# Patient Record
Sex: Female | Born: 1955 | Race: White | Hispanic: No | Marital: Married | Smoking: Never smoker
Health system: Southern US, Community
[De-identification: ages and names within clinical notes are randomized; demographics above are authoritative.]

## PROBLEM LIST (undated history)

## (undated) DIAGNOSIS — M8580 Other specified disorders of bone density and structure, unspecified site: Secondary | ICD-10-CM

## (undated) DIAGNOSIS — M810 Age-related osteoporosis without current pathological fracture: Secondary | ICD-10-CM

## (undated) DIAGNOSIS — K219 Gastro-esophageal reflux disease without esophagitis: Secondary | ICD-10-CM

## (undated) DIAGNOSIS — Z862 Personal history of diseases of the blood and blood-forming organs and certain disorders involving the immune mechanism: Secondary | ICD-10-CM

## (undated) DIAGNOSIS — E785 Hyperlipidemia, unspecified: Secondary | ICD-10-CM

## (undated) DIAGNOSIS — D509 Iron deficiency anemia, unspecified: Secondary | ICD-10-CM

## (undated) DIAGNOSIS — R740 Nonspecific elevation of levels of transaminase and lactic acid dehydrogenase [LDH]: Secondary | ICD-10-CM

## (undated) DIAGNOSIS — D6851 Activated protein C resistance: Secondary | ICD-10-CM

## (undated) DIAGNOSIS — D689 Coagulation defect, unspecified: Secondary | ICD-10-CM

## (undated) HISTORY — DX: Personal history of diseases of the blood and blood-forming organs and certain disorders involving the immune mechanism: Z86.2

## (undated) HISTORY — DX: Gastro-esophageal reflux disease without esophagitis: K21.9

## (undated) HISTORY — DX: Hyperlipidemia, unspecified: E78.5

## (undated) HISTORY — DX: Age-related osteoporosis without current pathological fracture: M81.0

## (undated) HISTORY — DX: Iron deficiency anemia, unspecified: D50.9

## (undated) HISTORY — DX: Coagulation defect, unspecified: D68.9

## (undated) HISTORY — DX: Nonspecific elevation of levels of transaminase and lactic acid dehydrogenase (ldh): R74.0

## (undated) HISTORY — DX: Other specified disorders of bone density and structure, unspecified site: M85.80

## (undated) HISTORY — PX: CARPAL TUNNEL RELEASE: SHX101

## (undated) HISTORY — DX: Activated protein C resistance: D68.51

---

## 1998-07-08 ENCOUNTER — Encounter: Payer: Self-pay | Admitting: Gynecology

## 1998-07-08 ENCOUNTER — Ambulatory Visit (HOSPITAL_COMMUNITY): Admission: RE | Admit: 1998-07-08 | Discharge: 1998-07-08 | Payer: Self-pay | Admitting: Gynecology

## 2000-01-15 ENCOUNTER — Encounter: Payer: Self-pay | Admitting: Gynecology

## 2000-01-15 ENCOUNTER — Encounter: Admission: RE | Admit: 2000-01-15 | Discharge: 2000-01-15 | Payer: Self-pay | Admitting: Gynecology

## 2000-03-29 ENCOUNTER — Other Ambulatory Visit: Admission: RE | Admit: 2000-03-29 | Discharge: 2000-03-29 | Payer: Self-pay | Admitting: Gynecology

## 2001-04-18 ENCOUNTER — Encounter: Admission: RE | Admit: 2001-04-18 | Discharge: 2001-04-18 | Payer: Self-pay | Admitting: Gynecology

## 2001-04-18 ENCOUNTER — Encounter: Payer: Self-pay | Admitting: Gynecology

## 2001-06-22 ENCOUNTER — Other Ambulatory Visit: Admission: RE | Admit: 2001-06-22 | Discharge: 2001-06-22 | Payer: Self-pay | Admitting: Gynecology

## 2001-06-27 ENCOUNTER — Encounter: Payer: Self-pay | Admitting: Gynecology

## 2001-06-27 ENCOUNTER — Encounter: Admission: RE | Admit: 2001-06-27 | Discharge: 2001-06-27 | Payer: Self-pay | Admitting: Gynecology

## 2001-07-12 ENCOUNTER — Encounter: Payer: Self-pay | Admitting: Neurology

## 2001-07-12 ENCOUNTER — Ambulatory Visit (HOSPITAL_COMMUNITY): Admission: RE | Admit: 2001-07-12 | Discharge: 2001-07-12 | Payer: Self-pay | Admitting: Neurology

## 2001-09-06 ENCOUNTER — Ambulatory Visit (HOSPITAL_BASED_OUTPATIENT_CLINIC_OR_DEPARTMENT_OTHER): Admission: RE | Admit: 2001-09-06 | Discharge: 2001-09-06 | Payer: Self-pay | Admitting: Orthopedic Surgery

## 2002-07-19 ENCOUNTER — Encounter: Payer: Self-pay | Admitting: Internal Medicine

## 2002-07-19 ENCOUNTER — Encounter: Admission: RE | Admit: 2002-07-19 | Discharge: 2002-07-19 | Payer: Self-pay | Admitting: Internal Medicine

## 2002-09-20 ENCOUNTER — Other Ambulatory Visit: Admission: RE | Admit: 2002-09-20 | Discharge: 2002-09-20 | Payer: Self-pay | Admitting: Gynecology

## 2003-09-10 ENCOUNTER — Encounter: Admission: RE | Admit: 2003-09-10 | Discharge: 2003-09-10 | Payer: Self-pay | Admitting: Gynecology

## 2004-03-19 ENCOUNTER — Other Ambulatory Visit: Admission: RE | Admit: 2004-03-19 | Discharge: 2004-03-19 | Payer: Self-pay | Admitting: Gynecology

## 2004-10-15 ENCOUNTER — Encounter: Admission: RE | Admit: 2004-10-15 | Discharge: 2004-10-15 | Payer: Self-pay | Admitting: Gynecology

## 2005-04-20 HISTORY — PX: UPPER GASTROINTESTINAL ENDOSCOPY: SHX188

## 2005-04-20 HISTORY — PX: COLONOSCOPY: SHX174

## 2005-04-20 LAB — HM COLONOSCOPY

## 2005-06-04 ENCOUNTER — Other Ambulatory Visit: Admission: RE | Admit: 2005-06-04 | Discharge: 2005-06-04 | Payer: Self-pay | Admitting: Gynecology

## 2005-07-22 ENCOUNTER — Ambulatory Visit: Payer: Self-pay | Admitting: Internal Medicine

## 2005-08-12 ENCOUNTER — Ambulatory Visit (HOSPITAL_COMMUNITY): Admission: RE | Admit: 2005-08-12 | Discharge: 2005-08-12 | Payer: Self-pay | Admitting: Internal Medicine

## 2005-08-18 ENCOUNTER — Encounter: Admission: RE | Admit: 2005-08-18 | Discharge: 2005-08-18 | Payer: Self-pay | Admitting: Internal Medicine

## 2005-08-18 ENCOUNTER — Encounter: Payer: Self-pay | Admitting: Internal Medicine

## 2005-08-25 ENCOUNTER — Ambulatory Visit: Payer: Self-pay | Admitting: Internal Medicine

## 2005-09-22 ENCOUNTER — Ambulatory Visit: Payer: Self-pay | Admitting: Internal Medicine

## 2005-11-05 ENCOUNTER — Encounter: Admission: RE | Admit: 2005-11-05 | Discharge: 2005-11-05 | Payer: Self-pay | Admitting: Gynecology

## 2005-11-13 ENCOUNTER — Encounter: Admission: RE | Admit: 2005-11-13 | Discharge: 2005-11-13 | Payer: Self-pay | Admitting: Gynecology

## 2005-11-20 ENCOUNTER — Ambulatory Visit: Payer: Self-pay | Admitting: Internal Medicine

## 2005-12-03 ENCOUNTER — Ambulatory Visit (HOSPITAL_COMMUNITY): Admission: RE | Admit: 2005-12-03 | Discharge: 2005-12-03 | Payer: Self-pay | Admitting: Internal Medicine

## 2005-12-29 ENCOUNTER — Ambulatory Visit: Payer: Self-pay | Admitting: Internal Medicine

## 2005-12-29 ENCOUNTER — Encounter: Payer: Self-pay | Admitting: Internal Medicine

## 2006-02-08 ENCOUNTER — Ambulatory Visit: Payer: Self-pay | Admitting: Internal Medicine

## 2006-05-05 ENCOUNTER — Encounter: Payer: Self-pay | Admitting: Internal Medicine

## 2006-05-05 LAB — CONVERTED CEMR LAB: Vit D, 1,25-Dihydroxy: 21 (ref 20–57)

## 2006-05-31 ENCOUNTER — Ambulatory Visit: Payer: Self-pay | Admitting: Internal Medicine

## 2006-09-07 DIAGNOSIS — G5600 Carpal tunnel syndrome, unspecified upper limb: Secondary | ICD-10-CM | POA: Insufficient documentation

## 2006-09-07 DIAGNOSIS — M8580 Other specified disorders of bone density and structure, unspecified site: Secondary | ICD-10-CM

## 2006-09-07 DIAGNOSIS — Z8719 Personal history of other diseases of the digestive system: Secondary | ICD-10-CM | POA: Insufficient documentation

## 2006-09-07 DIAGNOSIS — M810 Age-related osteoporosis without current pathological fracture: Secondary | ICD-10-CM | POA: Insufficient documentation

## 2006-09-07 DIAGNOSIS — K219 Gastro-esophageal reflux disease without esophagitis: Secondary | ICD-10-CM

## 2006-09-07 DIAGNOSIS — D649 Anemia, unspecified: Secondary | ICD-10-CM | POA: Insufficient documentation

## 2006-09-07 HISTORY — DX: Gastro-esophageal reflux disease without esophagitis: K21.9

## 2006-12-07 ENCOUNTER — Ambulatory Visit (HOSPITAL_COMMUNITY): Admission: RE | Admit: 2006-12-07 | Discharge: 2006-12-07 | Payer: Self-pay | Admitting: Gynecology

## 2006-12-15 ENCOUNTER — Other Ambulatory Visit: Admission: RE | Admit: 2006-12-15 | Discharge: 2006-12-15 | Payer: Self-pay | Admitting: Gynecology

## 2007-01-25 ENCOUNTER — Ambulatory Visit: Payer: Self-pay | Admitting: Internal Medicine

## 2007-01-26 ENCOUNTER — Encounter (INDEPENDENT_AMBULATORY_CARE_PROVIDER_SITE_OTHER): Payer: Self-pay | Admitting: *Deleted

## 2007-01-28 ENCOUNTER — Telehealth (INDEPENDENT_AMBULATORY_CARE_PROVIDER_SITE_OTHER): Payer: Self-pay | Admitting: *Deleted

## 2007-02-03 ENCOUNTER — Ambulatory Visit: Payer: Self-pay | Admitting: Internal Medicine

## 2007-02-06 LAB — CONVERTED CEMR LAB: Vit D, 1,25-Dihydroxy: 34 (ref 30–89)

## 2007-02-07 ENCOUNTER — Encounter (INDEPENDENT_AMBULATORY_CARE_PROVIDER_SITE_OTHER): Payer: Self-pay | Admitting: *Deleted

## 2007-02-24 ENCOUNTER — Ambulatory Visit: Payer: Self-pay | Admitting: Internal Medicine

## 2007-02-25 ENCOUNTER — Encounter (INDEPENDENT_AMBULATORY_CARE_PROVIDER_SITE_OTHER): Payer: Self-pay | Admitting: *Deleted

## 2007-02-25 LAB — CONVERTED CEMR LAB
ALT: 15 units/L (ref 0–35)
Albumin: 4 g/dL (ref 3.5–5.2)
BUN: 11 mg/dL (ref 6–23)
Basophils Absolute: 0 10*3/uL (ref 0.0–0.1)
Basophils Relative: 0.6 % (ref 0.0–1.0)
Bilirubin, Direct: 0.1 mg/dL (ref 0.0–0.3)
CO2: 33 meq/L — ABNORMAL HIGH (ref 19–32)
Chloride: 104 meq/L (ref 96–112)
Eosinophils Absolute: 0.2 10*3/uL (ref 0.0–0.6)
Eosinophils Relative: 3 % (ref 0.0–5.0)
GFR calc Af Amer: 135 mL/min
Glucose, Bld: 88 mg/dL (ref 70–99)
MCHC: 35.5 g/dL (ref 30.0–36.0)
MCV: 90.9 fL (ref 78.0–100.0)
Monocytes Relative: 7 % (ref 3.0–11.0)
Neutro Abs: 3.5 10*3/uL (ref 1.4–7.7)
Neutrophils Relative %: 59.3 % (ref 43.0–77.0)
Sodium: 141 meq/L (ref 135–145)
TSH: 2.47 microintl units/mL (ref 0.35–5.50)
Total Bilirubin: 0.6 mg/dL (ref 0.3–1.2)

## 2007-03-03 ENCOUNTER — Ambulatory Visit: Payer: Self-pay | Admitting: Internal Medicine

## 2007-03-03 DIAGNOSIS — E782 Mixed hyperlipidemia: Secondary | ICD-10-CM | POA: Insufficient documentation

## 2007-03-03 LAB — CONVERTED CEMR LAB: LDL Goal: 160 mg/dL

## 2007-05-05 ENCOUNTER — Telehealth (INDEPENDENT_AMBULATORY_CARE_PROVIDER_SITE_OTHER): Payer: Self-pay | Admitting: *Deleted

## 2007-08-08 ENCOUNTER — Telehealth (INDEPENDENT_AMBULATORY_CARE_PROVIDER_SITE_OTHER): Payer: Self-pay | Admitting: *Deleted

## 2008-01-03 ENCOUNTER — Ambulatory Visit (HOSPITAL_COMMUNITY): Admission: RE | Admit: 2008-01-03 | Discharge: 2008-01-03 | Payer: Self-pay | Admitting: Gynecology

## 2008-01-09 ENCOUNTER — Encounter: Admission: RE | Admit: 2008-01-09 | Discharge: 2008-01-09 | Payer: Self-pay | Admitting: Gynecology

## 2008-01-18 ENCOUNTER — Other Ambulatory Visit: Admission: RE | Admit: 2008-01-18 | Discharge: 2008-01-18 | Payer: Self-pay | Admitting: Gynecology

## 2008-02-08 ENCOUNTER — Telehealth (INDEPENDENT_AMBULATORY_CARE_PROVIDER_SITE_OTHER): Payer: Self-pay | Admitting: *Deleted

## 2008-03-02 ENCOUNTER — Telehealth (INDEPENDENT_AMBULATORY_CARE_PROVIDER_SITE_OTHER): Payer: Self-pay | Admitting: *Deleted

## 2008-03-16 ENCOUNTER — Telehealth (INDEPENDENT_AMBULATORY_CARE_PROVIDER_SITE_OTHER): Payer: Self-pay | Admitting: *Deleted

## 2008-06-14 ENCOUNTER — Telehealth (INDEPENDENT_AMBULATORY_CARE_PROVIDER_SITE_OTHER): Payer: Self-pay | Admitting: *Deleted

## 2008-07-02 ENCOUNTER — Telehealth (INDEPENDENT_AMBULATORY_CARE_PROVIDER_SITE_OTHER): Payer: Self-pay | Admitting: *Deleted

## 2008-07-03 ENCOUNTER — Telehealth (INDEPENDENT_AMBULATORY_CARE_PROVIDER_SITE_OTHER): Payer: Self-pay | Admitting: *Deleted

## 2008-08-10 ENCOUNTER — Telehealth (INDEPENDENT_AMBULATORY_CARE_PROVIDER_SITE_OTHER): Payer: Self-pay | Admitting: *Deleted

## 2008-11-29 ENCOUNTER — Telehealth (INDEPENDENT_AMBULATORY_CARE_PROVIDER_SITE_OTHER): Payer: Self-pay | Admitting: *Deleted

## 2008-12-05 ENCOUNTER — Encounter: Payer: Self-pay | Admitting: Internal Medicine

## 2008-12-05 LAB — CONVERTED CEMR LAB
Albumin: 4 g/dL (ref 3.5–5.2)
Alkaline Phosphatase: 59 units/L (ref 39–117)
BUN: 14 mg/dL (ref 6–23)
Basophils Relative: 1 % (ref 0–1)
Bilirubin, Direct: 0.1 mg/dL (ref 0.0–0.3)
CO2: 27 meq/L (ref 19–32)
Calcium: 9.1 mg/dL (ref 8.4–10.5)
Creatinine, Ser: 0.88 mg/dL (ref 0.40–1.20)
Eosinophils Absolute: 0.2 10*3/uL (ref 0.0–0.7)
Eosinophils Relative: 4 % (ref 0–5)
Glucose, Bld: 92 mg/dL (ref 70–99)
HCT: 38.2 % (ref 36.0–46.0)
HDL: 42 mg/dL (ref >39)
Indirect Bilirubin: 0.3 mg/dL (ref 0.0–0.9)
Lymphocytes Relative: 34 % (ref 12–46)
Lymphs Abs: 1.9 10*3/uL (ref 0.7–4.0)
MCV: 89.9 fL (ref 78.0–100.0)
Neutro Abs: 2.9 10*3/uL (ref 1.7–7.7)
Neutrophils Relative %: 52 % (ref 43–77)
TSH: 3.035 microintl units/mL (ref 0.350–4.500)
Total CHOL/HDL Ratio: 3.9
Total Protein: 6.9 g/dL (ref 6.0–8.3)
Triglycerides: 98 mg/dL (ref <150)
WBC: 5.6 10*3/uL (ref 4.0–10.5)

## 2008-12-12 ENCOUNTER — Ambulatory Visit: Payer: Self-pay | Admitting: Internal Medicine

## 2008-12-12 LAB — CONVERTED CEMR LAB
Bilirubin Urine: NEGATIVE
Glucose, Urine, Semiquant: NEGATIVE
Ketones, urine, test strip: NEGATIVE
Nitrite: NEGATIVE
Protein, U semiquant: NEGATIVE
pH: 6

## 2008-12-13 ENCOUNTER — Telehealth (INDEPENDENT_AMBULATORY_CARE_PROVIDER_SITE_OTHER): Payer: Self-pay | Admitting: *Deleted

## 2008-12-14 ENCOUNTER — Encounter: Payer: Self-pay | Admitting: Internal Medicine

## 2008-12-19 ENCOUNTER — Telehealth (INDEPENDENT_AMBULATORY_CARE_PROVIDER_SITE_OTHER): Payer: Self-pay | Admitting: *Deleted

## 2008-12-25 ENCOUNTER — Encounter (INDEPENDENT_AMBULATORY_CARE_PROVIDER_SITE_OTHER): Payer: Self-pay | Admitting: *Deleted

## 2008-12-25 LAB — CONVERTED CEMR LAB: Vit D, 25-Hydroxy: 37 ng/mL (ref 30–89)

## 2009-01-25 ENCOUNTER — Ambulatory Visit (HOSPITAL_COMMUNITY): Admission: RE | Admit: 2009-01-25 | Discharge: 2009-01-25 | Payer: Self-pay | Admitting: Internal Medicine

## 2009-01-25 ENCOUNTER — Encounter: Payer: Self-pay | Admitting: Internal Medicine

## 2009-02-01 ENCOUNTER — Ambulatory Visit: Payer: Self-pay | Admitting: Internal Medicine

## 2009-02-01 LAB — CONVERTED CEMR LAB: OCCULT 3: NEGATIVE

## 2009-02-04 ENCOUNTER — Encounter (INDEPENDENT_AMBULATORY_CARE_PROVIDER_SITE_OTHER): Payer: Self-pay | Admitting: *Deleted

## 2009-02-20 ENCOUNTER — Telehealth (INDEPENDENT_AMBULATORY_CARE_PROVIDER_SITE_OTHER): Payer: Self-pay | Admitting: *Deleted

## 2009-03-28 ENCOUNTER — Ambulatory Visit (HOSPITAL_COMMUNITY): Admission: RE | Admit: 2009-03-28 | Discharge: 2009-03-28 | Payer: Self-pay | Admitting: Gynecology

## 2009-04-01 ENCOUNTER — Ambulatory Visit (HOSPITAL_COMMUNITY): Admission: RE | Admit: 2009-04-01 | Discharge: 2009-04-01 | Payer: Self-pay | Admitting: Gynecology

## 2009-04-02 ENCOUNTER — Ambulatory Visit: Payer: Self-pay | Admitting: Internal Medicine

## 2009-04-02 ENCOUNTER — Encounter (INDEPENDENT_AMBULATORY_CARE_PROVIDER_SITE_OTHER): Payer: Self-pay | Admitting: *Deleted

## 2009-04-05 ENCOUNTER — Ambulatory Visit: Payer: Self-pay | Admitting: Internal Medicine

## 2009-05-29 ENCOUNTER — Telehealth (INDEPENDENT_AMBULATORY_CARE_PROVIDER_SITE_OTHER): Payer: Self-pay | Admitting: *Deleted

## 2009-08-14 ENCOUNTER — Telehealth (INDEPENDENT_AMBULATORY_CARE_PROVIDER_SITE_OTHER): Payer: Self-pay | Admitting: *Deleted

## 2009-11-15 ENCOUNTER — Telehealth (INDEPENDENT_AMBULATORY_CARE_PROVIDER_SITE_OTHER): Payer: Self-pay | Admitting: *Deleted

## 2009-11-27 ENCOUNTER — Encounter: Payer: Self-pay | Admitting: Internal Medicine

## 2010-01-24 ENCOUNTER — Encounter: Payer: Self-pay | Admitting: Internal Medicine

## 2010-01-24 LAB — CONVERTED CEMR LAB
Albumin: 4.2 g/dL
Basophils Relative: 1 %
Calcium: 9.4 mg/dL
HDL: 43 mg/dL
Hemoglobin: 12.6 g/dL
LDL Cholesterol: 140 mg/dL
MCV: 89 fL
Monocytes Absolute: 0.5 10*3/uL
Potassium: 4.4 meq/L
RDW: 15.8 %
Sodium: 137 meq/L
TSH: 5.8 microintl units/mL
Total Protein: 7 g/dL

## 2010-02-04 ENCOUNTER — Telehealth: Payer: Self-pay | Admitting: Internal Medicine

## 2010-02-25 ENCOUNTER — Encounter: Payer: Self-pay | Admitting: Internal Medicine

## 2010-03-24 ENCOUNTER — Encounter: Payer: Self-pay | Admitting: Internal Medicine

## 2010-03-24 ENCOUNTER — Ambulatory Visit: Payer: Self-pay | Admitting: Internal Medicine

## 2010-03-24 DIAGNOSIS — E559 Vitamin D deficiency, unspecified: Secondary | ICD-10-CM | POA: Insufficient documentation

## 2010-03-27 ENCOUNTER — Ambulatory Visit: Payer: Self-pay | Admitting: Internal Medicine

## 2010-03-27 ENCOUNTER — Encounter: Payer: Self-pay | Admitting: Internal Medicine

## 2010-03-29 LAB — CONVERTED CEMR LAB: Vit D, 25-Hydroxy: 90 ng/mL — ABNORMAL HIGH (ref 30–89)

## 2010-03-31 ENCOUNTER — Ambulatory Visit (HOSPITAL_COMMUNITY)
Admission: RE | Admit: 2010-03-31 | Discharge: 2010-03-31 | Payer: Self-pay | Source: Home / Self Care | Attending: Gynecology | Admitting: Gynecology

## 2010-05-11 ENCOUNTER — Encounter: Payer: Self-pay | Admitting: Gynecology

## 2010-05-12 ENCOUNTER — Ambulatory Visit
Admission: RE | Admit: 2010-05-12 | Discharge: 2010-05-12 | Payer: Self-pay | Source: Home / Self Care | Attending: Internal Medicine | Admitting: Internal Medicine

## 2010-05-22 NOTE — Assessment & Plan Note (Signed)
Summary: CPX///SPH   Vital Signs:  Patient profile:   55 year old female Height:      63.25 inches (160.66 cm) Weight:      127 pounds (57.73 kg) BMI:     22.40 Temp:     97.9 degrees F (36.61 degrees C) oral Resp:     14 per minute BP sitting:   102 / 60  (left arm) Cuff size:   regular  Vitals Entered By: Shannon West CMA (March 24, 2010 1:04 PM)  Is Patient Diabetic? No Pain Assessment Patient in pain? no      Comments Patient notes that she is not fasting, bloodwork was done at the hospital. Patient also needs refills of Nexium and Alendronate.   History of Present Illness:       Shannon West is here for a physical; she is asymptomatic. [facility] reviewed: LDL 140.       GERD/HH followup:  The patient reports acid reflux, but denies sour taste in mouth, epigastric pain, chest pain, trouble swallowing, weight loss, and weight gain.  The patient denies the following alarm features: melena, dysphagia, hematemesis, and vomiting.  Symptoms are worse with alcohol, spicy foods, and citrus.  Prior evaluation has included EGD.  The patient has found the following treatments to be effective: a PPI, Nexium.    Lipid Management History:      Positive NCEP/ATP III risk factors include female age 59 years old or older.  Negative NCEP/ATP III risk factors include no history of early menopause without estrogen hormone replacement, non-diabetic, no family history for ischemic heart disease, non-tobacco-user status, non-hypertensive, no ASHD (atherosclerotic heart disease), no prior stroke/TIA, no peripheral vascular disease, and no history of aortic aneurysm.     Current Medications (verified): 1)  Nexium 40 Mg  Cpdr (Esomeprazole Magnesium) .Marland Kitchen.. 1 Once Daily 2)  Vitamin D3 1000 Unit Caps (Cholecalciferol) .... 3 By Mouth Am, 3 By Mouth Pm 3)  Vit D Level .... Dx: 733.90 4)  Calcium 600mg  .... 1 By Mouth Two Times A Day 5)  Alendronate Sodium 70 Mg Tabs (Alendronate Sodium) .Marland Kitchen.. 1 Weekly As  Directed 6)  Citracal D .... Take As Directed 7)  Zyrtec-D Allergy & Congestion 5-120 Mg Xr12h-Tab (Cetirizine-Pseudoephedrine) .Marland Kitchen.. 1 By Mouth Once Daily As Needed 8)  Nature Made Vit D .... As Directed 9)  [employer] Brand Advil .Marland Kitchen.. 1 By Mouth As Needed 10)  [employer] Brand Tylenol .Marland Kitchen.. 1 By Mouth As Needed 11)  Otc Meds .... 1.) Zyrtec D  2.)stool Softner  3.)calcium  4.) Vit D  5.)tylenol  6.)advil/ibuprofen  Allergies (verified): No Known Drug Allergies  Past History:  Past Medical History: Anemia, PMH of (borderline) GERD/ Hiatal Hernia Osteopenia (T score -2.4 @ R  femoral neck 2010) G2 P2 Hyperlipidemia: NMR Lipoprofile : LDL 104 with 1759 total & 1610 small dense particles for 16-18 % risk; LDL goal = <80. Framingham Study LDL goal = < 160. Vitamin D deficiency (21 in 04/2006)  Past Surgical History: Carpal tunnel release RUE Colonoscopy  negative  2007; upper endoscopy  2007: hiatal hernia , Dr  Stan Head  Family History: Father: ruptured AAA,elevated  lipids Mother: osteoporosis, lipids, colon adenomas, hypothyroidism Siblings: bro :DM,lipids   Social History: no diet Occupation:Interventional Radiology Technician Never Smoked Alcohol use-yes: rarely Regular exercise-yes:3X/week X  30  min  Review of Systems  The patient denies anorexia, fever, vision loss, decreased hearing, hoarseness, syncope, prolonged cough, hemoptysis, hematuria, suspicious skin lesions,  depression, unusual weight change, abnormal bleeding, enlarged lymph nodes, and angioedema.    Physical Exam  General:  well-nourished,thin;alert,appropriate and cooperative throughout examination Head:  Normocephalic and atraumatic without obvious abnormalities.  Eyes:  No corneal or conjunctival inflammation noted.  Perrla. Funduscopic exam benign, without hemorrhages, exudates or papilledema.  Ears:  External ear exam shows no significant lesions or deformities.  Otoscopic examination reveals clear  canals, tympanic membranes are intact bilaterally without bulging, retraction, inflammation or discharge. Hearing is grossly normal bilaterally. Nose:  External nasal examination shows no deformity or inflammation. Nasal mucosa are pink and moist without lesions or exudates. Mouth:  Oral mucosa and oropharynx without lesions or exudates.  Teeth in good repair. Neck:  No deformities, masses, or tenderness noted. Thyroid smal & firm Lungs:  Normal respiratory effort, chest expands symmetrically. Lungs are clear to auscultation, no crackles or wheezes. Heart:  Normal rate and regular rhythm. S1 and S2 normal without gallop, murmur, click, rub. S4 Abdomen:  Bowel sounds positive,abdomen soft and non-tender without masses, organomegaly or hernias noted. Aorta palpable w/o AAA Genitalia:  Dr Chevis Pretty Msk:  No deformity or scoliosis noted of thoracic or lumbar spine.   Pulses:  R and L carotid,radial,dorsalis pedis and posterior tibial pulses are full and equal bilaterally Extremities:  No clubbing, cyanosis, edema, or deformity noted with normal full range of motion of all joints.   Neurologic:  alert & oriented X3 and DTRs symmetrical and normal.   Skin:  Intact without suspicious lesions or rashes Cervical Nodes:  No lymphadenopathy noted Axillary Nodes:  No palpable lymphadenopathy Psych:  memory intact for recent and remote, normally interactive, and good eye contact.     Impression & Recommendations:  Problem # 1:  ROUTINE GENERAL MEDICAL EXAM@HEALTH  CARE FACL (ICD-V70.0)  Orders: EKG w/ Interpretation (93000)  Problem # 2:  GERD (ICD-530.81)  Her updated medication list for this problem includes:    Nexium 40 Mg Cpdr (Esomeprazole magnesium) .Marland Kitchen... 1 once daily  Problem # 3:  OSTEOPENIA (ICD-733.90)  Her updated medication list for this problem includes:    Alendronate Sodium 70 Mg Tabs (Alendronate sodium) .Marland Kitchen... 1 weekly as directed  Problem # 4:  VITAMIN D DEFICIENCY  (ICD-268.9)  Problem # 5:  HYPERLIPIDEMIA (ICD-272.2)  Complete Medication List: 1)  Nexium 40 Mg Cpdr (Esomeprazole magnesium) .Marland Kitchen.. 1 once daily 2)  Vitamin D3 1000 Unit Caps (Cholecalciferol) .... 3 by mouth am, 3 by mouth pm 3)  Vit D Level  .... Dx: 733.90 4)  Calcium 600mg   .... 1 by mouth two times a day 5)  Alendronate Sodium 70 Mg Tabs (Alendronate sodium) .Marland KitchenMarland KitchenMarland Kitchen 1 weekly as directed 6)  Citracal D  .... Take as directed 7)  Zyrtec-d Allergy & Congestion 5-120 Mg Xr12h-tab (Cetirizine-pseudoephedrine) .Marland Kitchen.. 1 by mouth once daily as needed 8)  Nature Made Vit D  .... As directed 9)  [employer] Brand Advil  .Marland Kitchen.. 1 by mouth as needed 10)  [facility] Brand Tylenol  .Marland Kitchen.. 1 by mouth as needed 11)  Otc Meds  .... 1.) zyrtec d  2.)stool softner  3.)calcium  4.) vit d  5.)tylenol  6.)advil/ibuprofen  Lipid Assessment/Plan:      Based on NCEP/ATP III, the patient's risk factor category is "0-1 risk factors".  The patient's lipid goals are as follows: Total cholesterol goal is 200; LDL cholesterol goal is 80; HDL cholesterol goal is 40; Triglyceride goal is 150.  Her LDL cholesterol goal has not been met.  Secondary causes  for hyperlipidemia have been ruled out.  She has been counseled on adjunctive measures for lowering her cholesterol and has been provided with dietary instructions.    Patient Instructions: 1)  Please schedule fasting labs: vitamin D level & [facility] Lipid Panel (1304X). 2)  Avoid foods high in acid (tomatoes, citrus juices, spicy foods). Avoid eating within two hours of lying down or before exercising. Do not over eat; try smaller more frequent meals. Elevate head of bed twelve inches when sleeping. Prescriptions: ALENDRONATE SODIUM 70 MG TABS (ALENDRONATE SODIUM) 1 weekly as directed  #12 x 3   Entered and Authorized by:   Marga Melnick MD   Signed by:   Marga Melnick MD on 03/24/2010   Method used:   Faxed to ...       [facility]* (retail)        [address]       [address] Shipping/mailing       [place], [place]  [address]       Ph: [phone]       Fax: [phone]   RxID:   [id number] NEXIUM 40 MG  CPDR (ESOMEPRAZOLE MAGNESIUM) 1 once daily  #90 x 3   Entered and Authorized by:   Marga Melnick MD   Signed by:   Marga Melnick MD on 03/24/2010   Method used:   Faxed to ...       [facility]* (retail)       [address]       [address] Shipping/mailing       [place], [place]  [address]       Ph: [phone]       Fax: [phone]   RxID:   [id number]    Orders Added: 1)  Est. Patient 40-64 years [99396] 2)  EKG w/ Interpretation [93000]

## 2010-05-22 NOTE — Progress Notes (Signed)
Summary: Request for OTC Meds   Phone Note Call from Patient Call back at Home Phone [phone]   Caller: Patient Summary of Call: Refill Request for OTC Meds Zyrtec D, Ctiracal D, Nature Made Vit D, [place] Brand Advil/Tylenol   Shonna Chock CMA  November 15, 2009 5:28 PM   Follow-up for Phone Call        Left message on VM informing patient to call with the [facility] location to send her RX's Follow-up by: Shonna Chock CMA,  November 18, 2009 4:51 PM  Additional Follow-up for Phone Call Additional follow up Details #1::        Patient called back yesterday afternoon and indicated to send 1 script with all the meds listed to [facility], Phone number is [phone]  I called [facility] and Faxed RX to [phone] Additional Follow-up by: Shonna Chock CMA,  November 20, 2009 11:24 AM    New/Updated Medications: * CITRACAL D take as directed ZYRTEC-D ALLERGY & CONGESTION 5-120 MG XR12H-TAB (CETIRIZINE-PSEUDOEPHEDRINE) 1 by mouth once daily as needed * NATURE MADE VIT D as directed * [facility] ADVIL 1 by mouth as needed * [facility] BRAND TYLENOL 1 by mouth as needed * OTC MEDS 1.) Zyrtec D  2.)Stool Softner  3.)Calcium  4.) Vit D  5.)Tylenol  6.)Advil/Ibuprofen Prescriptions: OTC MEDS 1.) Zyrtec D  2.)Stool Softner  3.)Calcium  4.) Vit D  5.)Tylenol  6.)Advil/Ibuprofen  #1 x prn   Entered by:   Shonna Chock CMA   Authorized by:   Marga Melnick MD   Signed by:   Shonna Chock CMA on 11/20/2009   Method used:   Print then Give to Patient   RxID:   [id number]   Appended Document: Request for OTC Meds  faxed.

## 2010-05-22 NOTE — Miscellaneous (Signed)
Summary: [facility] 01/24/10  Clinical Lists Changes  Observations: Added new observation of BASOPHIL %: 1.0 % (01/24/2010 15:10) Added new observation of % EOS AUTO: 3.0 % (01/24/2010 15:10) Added new observation of MONOCYTE %: 9.0 % (01/24/2010 15:10) Added new observation of LYMPHS %: 31.0 % (01/24/2010 15:10) Added new observation of RDW: 15.8 % (01/24/2010 15:10) Added new observation of MCHC RBC: 31.8 g/dL (84/16/6063 01:60) Added new observation of MCV: 89.0 fL (01/24/2010 15:10) Added new observation of HCT: 39.6 % (01/24/2010 15:10) Added new observation of ABSOLUTE BAS: 0.0 K/uL (01/24/2010 15:10) Added new observation of EOS ABSLT: 0.2 K/uL (01/24/2010 15:10) Added new observation of ABSOLUTE MON: 0.5 K/uL (01/24/2010 15:10) Added new observation of ABS LYMPHOCY: 1.9 K/uL (01/24/2010 15:10) Added new observation of PLATELETK/UL: 245 K/uL (01/24/2010 15:10) Added new observation of HGB: 12.6 g/dL (10/93/2355 73:22) Added new observation of WBC COUNT: 6.1 10*3/microliter (01/24/2010 15:10) Added new observation of CL SERUM: 99.0 meq/L (01/24/2010 15:10) Added new observation of K SERUM: 4.4 meq/L (01/24/2010 15:10) Added new observation of NA: 137.0 meq/L (01/24/2010 15:10) Added new observation of CALCIUM: 9.4 mg/dL (02/54/2706 23:76) Added new observation of ALBUMIN: 4.2 g/dL (28/31/5176 16:07) Added new observation of PROTEIN, TOT: 7.0 g/dL (37/01/6268 48:54) Added new observation of CREATININE: 0.8 mg/dL (62/70/3500 93:81) Added new observation of BUN: 16.0 mg/dL (82/99/3716 96:78) Added new observation of CO2 PLSM/SER: 28.0 meq/L (01/24/2010 15:10) Added new observation of TSH: 5.8 microintl units/mL (01/24/2010 15:10) Added new observation of BILI TOTAL: 0.4 mg/dL (93/81/0175 10:25) Added new observation of ALK PHOS: 64.0 units/L (01/24/2010 15:10) Added new observation of SGPT (ALT): 16.0 units/L (01/24/2010 15:10) Added new observation of SGOT (AST): 20.0 units/L  (01/24/2010 15:10) Added new observation of TRIGLYCERIDE: 119.0 mg/dL (85/27/7824 23:53) Added new observation of LDL: 140 mg/dL (61/44/3154 00:86) Added new observation of HDL: 43.0 mg/dL (76/19/5093 26:71) Added new observation of CHOLESTEROL: 207.0 mg/dL (24/58/0998 33:82)

## 2010-05-22 NOTE — Medication Information (Signed)
Summary: OTC Meds  OTC Meds   Imported By: Lanelle Bal 12/09/2009 09:17:57  _____________________________________________________________________  External Attachment:    Type:   Image     Comment:   External Document

## 2010-05-22 NOTE — Progress Notes (Signed)
Summary: Lab order--need for Dec 2011  Phone Note Call from Patient Call back at Work Phone [phone]   Caller: Patient Summary of Call: PATIENT IS SCHEDULED FOR CPX WITH DR HOPPER ON 12/5----SHE SAID CHRAE  WOULD FAX ORDERS TO HER AT WORK AT [place] AND SHE WOULD HAVE LABS DRAWN THERE--SAYS SHE WAS TOLD IN THE PAST THAT SHE NEEDED TO HAVE LIPID PROFILE PANEL, BUT DOESNT THINK SHE HAS HAD THAT YET  FAX NUMBER IS [phone] Initial call taken by: Jerolyn Shin,  February 04, 2010 10:25 AM  Follow-up for Phone Call        Patient will need Vitamin D, NMR Profile, cbc diff, bmet, lipid profile, liver profile and TSH. Dx:  v70.0, 272.2, and 268.9.  Called to discuss with patient and was notified that she is already gone for the day, will try back [place] tomorrow. Follow-up by: Lucious Groves CMA,  February 04, 2010 3:56 PM  Additional Follow-up for Phone Call Additional follow up Details #1::        Patient is not at work yet, must try back. Lucious Groves CMA  February 05, 2010 8:39 AM   Left message for patient to call back to office. Lucious Groves CMA  February 05, 2010 11:11 AM   Patient notified that the orders have been ordered, but she will wait until closer to her appt. She will call back when she needs the labs faxed to the number above. Lucious Groves CMA  February 05, 2010 11:51 AM

## 2010-05-22 NOTE — Assessment & Plan Note (Signed)
Summary: go over [facility] ///sph   Vital Signs:  Patient profile:   55 year old female Pulse rate:   64 / minute Resp:     12 per minute BP sitting:   116 / 70  History of Present Illness: Hyperlipidemia Follow-Up      This is a 55 year old woman who presents for Hyperlipidemia follow-up & review of [facility] Panel. Main risks appear to be elevated LP(a) & LDL  of 157. The patient denies the following symptoms: chest pain/pressure, exercise intolerance, dypsnea, palpitations, syncope, and pedal edema.  Dietary compliance has been good.  The patient reports exercising 3 X per week.  Adjunctive measures currently used by the patient include fiber.    Allergies: No Known Drug Allergies  Past History:  Past Medical History: Anemia, PMH of (borderline) GERD/ Hiatal Hernia Osteopenia (T score -2.4 @ R  femoral neck 2010) G2 P2 Hyperlipidemia: NMR Lipoprofile : LDL 104 with 1759 total & 1610 small dense particles, HDL 46, TG 112  for 16-18 % risk; LDL goal = <80. Framingham Study LDL goal = < 160.  LP(a) 76 ( < 20).No FH of premature CAD/MI. Vitamin D deficiency (21 in 04/2006)  Physical Exam  General:  well-nourished , thin,alert,appropriate and cooperative throughout examination Heart:  Normal rate and regular rhythm. S1 and S2 normal without gallop, murmur, click, rub or other extra sounds. Pulses:  R and L carotid,radial,dorsalis pedis and posterior tibial pulses are full and equal bilaterally Psych:  Intelligent & motivated   Impression & Recommendations:  Problem # 1:  HYPERLIPIDEMIA (ICD-272.2)  Her updated medication list for this problem includes:    Niaspan 1000 Mg Cr-tabs (Niacin (antihyperlipidemic)) .Marland Kitchen... 1 once daily as directed  Complete Medication List: 1)  Nexium 40 Mg Cpdr (Esomeprazole magnesium) .Marland Kitchen.. 1 once daily 2)  Vitamin D3 1000 Unit Caps (Cholecalciferol) .... 3 by mouth am, 3 by mouth pm 3)  Vit D Level  .... Dx: 733.90 4)  Calcium 600mg   .... 1  by mouth two times a day 5)  Alendronate Sodium 70 Mg Tabs (Alendronate sodium) .Marland KitchenMarland KitchenMarland Kitchen 1 weekly as directed 6)  Citracal D  .... Take as directed 7)  Zyrtec-d Allergy & Congestion 5-120 Mg Xr12h-tab (Cetirizine-pseudoephedrine) .Marland Kitchen.. 1 by mouth once daily as needed 8)  Nature Made Vit D  .... As directed 9)  [employer] Brand Advil  .Marland Kitchen.. 1 by mouth as needed 10)  [facility] Brand Tylenol  .Marland Kitchen.. 1 by mouth as needed 11)  Otc Meds  .... 1.) zyrtec d  2.)stool softner  3.)calcium  4.) vit d  5.)tylenol  6.)advil/ibuprofen 12)  Niaspan 1000 Mg Cr-tabs (Niacin (antihyperlipidemic)) .Marland Kitchen.. 1 once daily as directed  Patient Instructions: 1)  It is important that you exercise regularly at least 20 minutes 5 times a week. If you develop chest pain, have severe difficulty breathing, or feel very tired , stop exercising immediately and seek medical attention. 2)  Take an  81 mg coated Aspirin every day. 3)  Please schedule a follow-up appointment in 6 months. 4)  Lipid Panel prior to visit, ICD-9: Prescriptions: NIASPAN 1000 MG CR-TABS (NIACIN (ANTIHYPERLIPIDEMIC)) 1 once daily as directed  #30 x  5   Entered and Authorized by:   Marga Melnick MD   Signed by:   Marga Melnick MD on 05/12/2010   Method used:   Print then Give to Patient   RxID:   [id number]    Orders Added: 1)  Est. Patient  Level III [[address]]

## 2010-05-22 NOTE — Progress Notes (Signed)
Summary: OTC med request  Phone Note Call from Patient Call back at Work Phone [phone]   Caller: Patient Summary of Call: Message left on VM: Patient needs rx for OTC meds: Cough/Cold meds, pain releivers, sinus/allergy meds, and stool softner. Please fax to [phone]   I called and checked with a local pharmacy and they indicatecd that the meds have to be specific ie: Mucinex cough/cold vs cough/cold med. I called patient and left a message for her to call with specific names. Initial call taken by: Shonna Chock,  August 14, 2009 9:44 AM  Follow-up for Phone Call        Spoke with patient and she said she will have to sit down and really think about the specific brands she uses, patient said this will be a long list and she will just fax it to me when she has it avaliable Follow-up by: Shonna Chock,  August 15, 2009 11:08 AM

## 2010-05-22 NOTE — Progress Notes (Signed)
Summary: refill request  Phone Note Refill Request Message from:  Fax from Pharmacy on May 29, 2009 10:16 AM  Refills Requested: Medication #1:  ALENDRONATE SODIUM 70 MG TABS 1 weekly as directed.   Dosage confirmed as above?Dosage Confirmed Refill to [facility]   Initial call taken by: Michaelle Copas,  May 29, 2009 10:23 AM     Prescriptions: ALENDRONATE SODIUM 70 MG TABS (ALENDRONATE SODIUM) 1 weekly as directed  #12 x 3   Entered by:   Shonna Chock   Authorized by:   Marga Melnick MD   Signed by:   Shonna Chock on 05/29/2009   Method used:   Electronically to        [facility]* (retail)       [address]       [address] [place] [address]/mailing       [place], [place]  [address]       Ph: [phone]       Fax: [phone]   RxID:   [id number]

## 2010-05-29 ENCOUNTER — Telehealth: Payer: Self-pay | Admitting: Internal Medicine

## 2010-06-05 NOTE — Progress Notes (Signed)
Summary: Niaspan 500mg  Samples  Phone Note Call from Patient Call back at Home Phone [phone]   Summary of Call: Patient called noting that she has not had any problems with the Niaspan. She has been taking the 500mg  samples that we given to her in the office. Patient cannot pick up the prescription she was given because her temporary insurance has not kicked in yet. Patient would like to know if she could get more samples (I made her aware that we do not have the 1000, ONLY 500mg ). Is it ok to give the patient the 500mg  samples and should the patient take one or tow daily? Please advise. Initial call taken by: Lucious Groves CMA,  May 29, 2010 3:01 PM  Follow-up for Phone Call        continue 1 once daily until Rx coverage in place,then increase to 1000 mg; samples OK  Follow-up by: Marga Melnick MD,  May 30, 2010 5:28 AM  Additional Follow-up for Phone Call Additional follow up Details #1::        Patient notified. (#28 given). Additional Follow-up by: Lucious Groves CMA,  May 30, 2010 8:40 AM

## 2010-09-05 NOTE — Assessment & Plan Note (Signed)
[facility]                           GASTROENTEROLOGY OFFICE NOTE   NAME:Shannon West, Shannon West                        MRN:          [id number]  DATE:02/08/2006                            DOB:          14-Jun-1955    CHIEF COMPLAINT:  Followup of reflux problems after colonoscopy and  endoscopy.   Asti returns, doing well on Protonix 40 mg daily.  She said that Prilosec  worked well too, and would like to switch to that since it is cheaper.  There is no dysphagia.  I saw no stricture on her endoscopy, though she had  a small sliding hiatal hernia.  She, apparently, has some enlargement of her  thyroid on ultrasound, and I thought that might have had a relationship to  her dysphagia.  At any rate, there are no symptoms at this time.  Her small  bowel biopsies for sprue were negative.  No constipation now.  Colonoscopy  demonstrated some skin tags, otherwise normal.   VITAL SIGNS:  Weight 122 pounds.  Height 5 feet 3 inches.  Pulse 60.  Blood  pressure 100/60.   ASSESSMENT:  1. Dysphagia and reflux symptoms, better at this time.  There are no      worrisome findings on her endoscopic studies.  2. Normal colonoscopy.   PLAN:  1. Change to Prilosec OTC.  I have given her a prescription for that since      she can get that for 5 dollars with her med cost insurance.  2. She can try to taper off of that at some point to see if she needs it      only intermittently, but she is to take it daily for a while, perhaps a      couple of months.  3. It is okay to start Boniva.  4. Return to see me as needed.       Iva Boop, MD,FACG      CEG/MedQ  DD:  02/08/2006  DT:  02/09/2006  Job #:  [id number]   cc:   Titus Dubin. Alwyn Ren, MD,FACP,FCCP

## 2010-09-05 NOTE — Assessment & Plan Note (Signed)
[facility]                           GASTROENTEROLOGY OFFICE NOTE   NAME:Shannon West, Shannon West                        MRN:          [id number]  DATE:11/20/2005                            DOB:          1955-06-07    REQUESTING PHYSICIAN:  Titus Dubin. Alwyn Ren, MD, FCCP   REASON FOR CONSULTATION:  Question of reflux, anemia, constipation.   ASSESSMENT:  55 year old white woman with multiple problems.  She has a  borderline anemia with a low iron saturation, but a low normal iron and  normal transferrin.   She also has loss of dental enamel, and her dentist has questioned whether  she could have reflux disease.  There is also some dysphagia, but apparently  enlargement of the thyroid on a recent thyroid ultrasound.  She belches  quite a bit as well.  She has fairly chronic constipation.   RECOMMENDATIONS AND PLAN:  1.  Barium swallow with tablet regarding the dysphagia.  2.  EGD regarding dysphagia and anemia and reflux symptoms.  3.  Colonoscopy, mainly for screening.  4.  Agree with holding bisphosphonate at this point.  She has osteopenia,      Boniva is considered.  5.  Note:  Her mother has had colon polyps.  I am not sure that raises her      risk of polyps or cancer all that much with one relative.  It sounds      like she was elderly when she had them.  6.  She uses well water, and both her husband and daughter have been told      they have poor enamel, so maybe that is the problem, i.e. lack of      fluoride.  She does use fluoridated toothpaste.  It is possible that      acid reflux can cause dental enamel loss, but to have the whole family      have reflux and dental enamel loss does not fit well, and I think it may      be the lack of fluoride in her water.  She will discuss this with her      dentist.   I will also check a ferritin and a repeat hemoglobin/CBC today.   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  She has  had chronic hard stools for years.  She burps and belches a lot, and  Prilosec is helping with heartburn, but she still has the burping and  belching.  She has been given a prescription for Boniva, but it has been  held because of the reflux problems, and sometimes food not going down well  and seems like it is sitting in suprasternal area.  She cannot give me  obvious specific examples of impact dysphagia, but says that things do not  go down quite right.  She has had an abnormal thyroid ultrasound, she tells  me, where the isthmus was thickened.  There has been no weight loss.  She  has tried stool softeners and  things like that to try to help her bowels  move better, there is some quality of life issue there.  She has tried some  fiber supplements, but never for a long time.  Other issues as described  above.  Stool cards have been negative when tested.  Laboratory  investigation, in April 2007, showed hemoglobin 11.9, lipid panel showed an  LDL of 117, she had a normal CMET, normal TSH and free T4.   PAST MEDICAL HISTORY:  1.  Newly diagnosed osteopenia.  2.  Seasonal allergies.  3.  No surgeries.   FAMILY HISTORY:  Prostate cancer in her grandfather, heart disease in her  father, diabetes in her grandmother.   SOCIAL HISTORY:  As above, married, one son, one daughter.  No alcohol,  tobacco or drugs.   REVIEW OF SYSTEMS:  See medical history form.   PHYSICAL EXAMINATION:  GENERAL:  Petite, thin, well-developed white woman.  VITAL SIGNS:  Height 5 feet 3 inches, weight 122 pounds.  Blood pressure  100/60, pulse 68.  EYES:  Anicteric.  ENT:  Normal mouth and posterior pharynx.  NECK:  Supple, there is no mass.  Perhaps slight thyromegaly right greater  than left lobes.  ABDOMEN:  Soft, nontender, without organomegaly or mass.  HEART:  S1, S2, no murmurs or gallops.  LUNGS:  Clear.  RECTAL:  Exam  deferred.  LYMPHATIC:  No neck or supraclavicular nodes.  EXTREMITIES:  No edema.  SKIN:  No rash.  NEUROLOGIC:  She is alert and oriented x3.   Note:  Sprue was a consideration in this lady as well with her osteopenia  and her bowel habit issues, etcetera.  We can consider testing for that,  though I could take biopsies of the small bowel at the time of her EGD as  well.   I appreciate the opportunity to care for this patient.  We have recommended  fiber supplementation and a high fiber diet as well.  Further plans pending  the above.                                   Iva Boop, MD, [facility]   CEG/MedQ  DD:  11/20/2005  DT:  11/21/2005  Job #:  [id number]   cc:   Titus Dubin. Alwyn Ren, MD, FCCP

## 2010-09-05 NOTE — Op Note (Signed)
[place]  Patient:    Shannon West, Shannon West Visit Number: [id number] MRN: [id number]          Service Type: DSU Location: [facility] Attending Physician:  Susa Day Dictated by:   Katy Fitch Naaman Plummer., M.D. Proc. Date: 09/06/01 Admit Date:  09/06/2001                             Operative Report  PREOPERATIVE DIAGNOSIS:  Right carpal tunnel syndrome.  POSTOPERATIVE DIAGNOSIS:  Right carpal tunnel syndrome.  OPERATION PERFORMED:  Release of right transverse carpal ligament.  OPERATING SURGEON:  Josephine Igo, M.D.  ASSISTANT:  Annye Rusk, P.A-C.  ANESTHESIA:  General by mask.  ANESTHESIOLOGIST:  Dr. Randa Evens.  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  She has had a longstanding history of hand numbness consistent with carpal tunnel syndrome only on the right side.  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  Electrodiagnostic studies completed by Dr. Johna Roles on August 01, 2001 demonstrated marked prolongation of her motor and sensory latencies as well as a very prolonged lumbrical interosseous difference on the right and normal conduction studies on the left.  Due to failure of nonoperative measures, the patient is brought to the operating room at this time for release of her right transverse carpal ligament.  DESCRIPTION OF PROCEDURE:  [name] was brought to the operating room and placed in supine position on the operating table.  Following induction of general anesthesia, the right arm was prepped with Betadine soap and solution and sterilely draped.  The arm was exsanguinated with an Esmarch bandage and an arterial tourniquet on the proximal brachium was inflated to 220 mHg.  The  procedure commenced with a short incision in line of the ring finger in the palm.  The subcutaneous tissues were carefully divided revealing the palmar fascia.  The fibers of the fascia were split longitudinally to reveal the common sensory branch of the median nerve and superficial palmar arch.  The median nerve proper was gently isolated from the transverse carpal ligament and the ligament was released on its ulnar border with scissors extending into the distal forearm.  This widely opened the carpal canal.  The ulnar bursa was noted to be thickened and fibrotic.  There were no masses or other predicaments noted.  Bleeding points along the margins of the released ligament were electrocauterized with bipolar current followed by repair of the skin with intradermal 3-0 Prolene suture.  A compressive dressing was applied with a volar plaster splint maintaining the wrist in 5 degrees dorsiflexion. Dictated by:   Katy Fitch Naaman Plummer., M.D. Attending Physician:  Susa Day DD:  09/06/01 TD:  09/06/01 Job: [id number]

## 2010-10-19 ENCOUNTER — Inpatient Hospital Stay (INDEPENDENT_AMBULATORY_CARE_PROVIDER_SITE_OTHER)
Admission: RE | Admit: 2010-10-19 | Discharge: 2010-10-19 | Disposition: A | Discharge status: Home / Self Care | Payer: Self-pay | Source: Ambulatory Visit | Attending: Emergency Medicine | Admitting: Emergency Medicine

## 2010-10-19 ENCOUNTER — Encounter: Payer: Self-pay | Admitting: Emergency Medicine

## 2010-10-19 DIAGNOSIS — E860 Dehydration: Secondary | ICD-10-CM

## 2010-10-19 DIAGNOSIS — K5289 Other specified noninfective gastroenteritis and colitis: Secondary | ICD-10-CM | POA: Insufficient documentation

## 2010-10-21 ENCOUNTER — Telehealth (INDEPENDENT_AMBULATORY_CARE_PROVIDER_SITE_OTHER): Payer: Self-pay | Admitting: *Deleted

## 2010-10-29 ENCOUNTER — Other Ambulatory Visit: Payer: Self-pay | Admitting: Internal Medicine

## 2010-10-29 DIAGNOSIS — E785 Hyperlipidemia, unspecified: Secondary | ICD-10-CM

## 2010-10-30 ENCOUNTER — Other Ambulatory Visit (INDEPENDENT_AMBULATORY_CARE_PROVIDER_SITE_OTHER): Payer: 59

## 2010-10-30 DIAGNOSIS — E785 Hyperlipidemia, unspecified: Secondary | ICD-10-CM

## 2010-10-30 NOTE — Progress Notes (Signed)
Labs only

## 2010-11-01 ENCOUNTER — Encounter: Payer: Self-pay | Admitting: Internal Medicine

## 2010-11-06 ENCOUNTER — Encounter: Payer: Self-pay | Admitting: Internal Medicine

## 2010-11-06 ENCOUNTER — Ambulatory Visit (INDEPENDENT_AMBULATORY_CARE_PROVIDER_SITE_OTHER): Payer: 59 | Admitting: Internal Medicine

## 2010-11-06 DIAGNOSIS — E782 Mixed hyperlipidemia: Secondary | ICD-10-CM

## 2010-11-06 NOTE — Patient Instructions (Signed)
Please review Dr Gildardo Griffes book Eat, Drink & Be Healthy for dietary cholesterol information due to your borderline cholesterol hyperabsorption. Exercise  30-45  minutes a day, 3-4 days a week. Walking is especially valuable in preventing Osteoporosis. Eat a low-fat diet with lots of fruits and vegetables, up to 7-9 servings per day.  Take the Niaspan with unsweetened applesauce. The  Pectin in the applesauce  will help mitigate any flushing. The Niaspan as multiple benefits: Increase in HDL; decreasing Lp(a); and  LDL  particle size

## 2010-11-06 NOTE — Progress Notes (Signed)
Addended byMarga Melnick F on: 11/06/2010 05:17 PM   Modules accepted: Level of Service

## 2010-11-06 NOTE — Progress Notes (Signed)
  Subjective:    Patient ID: Shannon West, female    DOB: 1956/02/04, 55 y.o.   MRN: [id number]  HPI Dyslipidemia assessment: Prior Advanced Lipid Testing: NMR 2008: LDL goal = < 75.   Family history of premature CAD/ MI: no .  Nutrition: heart healthy .  Exercise: 2X/week . Diabetes : A1c 5.6% in 12/11 . HTN: no. Smoking history  : never .   Weight :  stable.  Lab results reviewed :LDL 111(157 in  12/11).     Review of Systems ROS: fatigue: no ; chest pain : no ;claudication: no; palpitations: no; abd pain/bowel changes: no ; myalgias:no;  syncope : no ; memory loss: no;skin changes: no; Flushing : X 2.     Objective:   Physical Exam Gen.: Thin but healthy and well-nourished in appearance. Alert, appropriate and cooperative throughout exam.  Lungs: Normal respiratory effort; chest expands symmetrically. Lungs are clear to auscultation without rales, wheezes, or increased work of breathing. Heart: Normal rate and rhythm. Normal S1 and S2. No gallop, click, or rub. S4 w/o  murmur.                                                                                    Musculoskeletal/extremities:  No clubbing, cyanosis, edema, or deformity noted. .Joints normal. Nail health  good. Vascular: Carotid, radial artery, dorsalis pedis and  posterior tibial pulses are full and equal. No bruits present. Neurologic: Alert and oriented x3.  Skin: Intact without suspicious lesions or rashes. Psych: Mood and affect are normal. Normally interactive                                                                                         Assessment & Plan:  #1 dyslipidemia; significant improvement in LDL. It has dropped from 157 in 04/1999 to 111. Optimal would be less than 100  Advanced cholesterol testing does suggest that she may have increased absorption of cholesterol. Dr. Gildardo Griffes book is  recommended along with a regular cardiovascular exercise  #3 LP(a) is a significant risk ; it is   recommended she continue the Niaspan. The  flushing could be prevented  with the ingestion of applesauce

## 2010-12-26 ENCOUNTER — Other Ambulatory Visit: Payer: Self-pay | Admitting: Internal Medicine

## 2011-03-23 NOTE — Progress Notes (Signed)
Summary: dizzy/anorexia/diarrhea/TM(rm4)   Vital Signs:  Patient Profile:   55 Years Old Female CC:      dizzy ;pst pf appeti Height:     63.25 inches (160.66 cm) Weight:      118.8 pounds O2 Sat:      99 % O2 treatment:    Room Air Temp:     98.3 degrees F oral Pulse rate:   88 / minute Resp:     18 per minute BP supine:   113 / 74  (left arm) BP sitting:   103 / 76  (left arm) BP standing:   101 / 69  (left arm) Cuff size:   regular  Vitals Entered By: Linton Flemings RN (October 19, 2010 12:05 PM)                  Updated Prior Medication List: NEXIUM 40 MG  CPDR (ESOMEPRAZOLE MAGNESIUM) 1 once daily VITAMIN D3 1000 UNIT CAPS (CHOLECALCIFEROL) 3 by mouth am, 3 by mouth pm * VIT D LEVEL DX: 733.90 * CALCIUM 600MG  1 by mouth two times a day ALENDRONATE SODIUM 70 MG TABS (ALENDRONATE SODIUM) 1 weekly as directed * CITRACAL D take as directed ZYRTEC-D ALLERGY & CONGESTION 5-120 MG XR12H-TAB (CETIRIZINE-PSEUDOEPHEDRINE) 1 by mouth once daily as needed * NATURE MADE VIT D as directed * [employer] BRAND ADVIL 1 by mouth as needed * [employer] BRAND TYLENOL 1 by mouth as needed * OTC MEDS 1.) Zyrtec D  2.)Stool Softner  3.)Calcium  4.) Vit D  5.)Tylenol  6.)Advil/Ibuprofen NIASPAN 1000 MG CR-TABS (NIACIN (ANTIHYPERLIPIDEMIC)) 1 once daily as directed  Current Allergies (reviewed today): No known allergies History of Present Illness History from: patient Chief Complaint: dizzy ;pst pf appeti History of Present Illness: She is having 4 days of chills that have turned into fatigue, malaise, not wanting to eat, a feeling of dehydration, loose stools (no blood), and dizziness this past 1-2 days.  She reports that her symptoms are mild but are lingering and she feels she needs IVF.  She is taking Tylenol and Ibu which have helped.  She hasn't been eating or drinking much because nothing tastes good.  She is otherwise healthy.  No rashes, tick bites, recent travel, sick contacts.  No  urinary symptoms.  No new meds.  REVIEW OF SYSTEMS Constitutional Symptoms       Complains of fever and chills.     Denies night sweats, weight loss, weight gain, and fatigue.  Eyes       Denies change in vision, eye pain, eye discharge, glasses, contact lenses, and eye surgery. Ear/Nose/Throat/Mouth       Denies hearing loss/aids, change in hearing, ear pain, ear discharge, dizziness, frequent runny nose, frequent nose bleeds, sinus problems, sore throat, hoarseness, and tooth pain or bleeding.  Respiratory       Denies dry cough, productive cough, wheezing, shortness of breath, asthma, bronchitis, and emphysema/COPD.  Cardiovascular       Complains of fanting.      Denies murmurs, chest pain, and fainting.    Gastrointestinal       Complains of nausea/vomiting and diarrhea.      Denies stomach pain, constipation, blood in bowel movements, and indigestion. Genitourniary       Denies painful urination, kidney stones, and loss of urinary control. Neurological       Denies paralysis, seizures, and fainting/blackouts. Musculoskeletal       Denies muscle pain, joint pain, joint stiffness, decreased range  of motion, redness, swelling, muscle weakness, and gout.  Skin       Denies bruising, unusual mles/lumps or sores, and hair/skin or nail changes.  Psych       Denies mood changes, temper/anger issues, anxiety/stress, speech problems, depression, and sleep problems.  Past History:  Past Medical History: Reviewed history from 05/12/2010 and no changes required. Anemia, PMH of (borderline) GERD/ Hiatal Hernia Osteopenia (T score -2.4 @ R  femoral neck 2010) G2 P2 Hyperlipidemia: NMR Lipoprofile : LDL 104 with 1759 total & 1610 small dense particles, HDL 46, TG 112  for 16-18 % risk; LDL goal = <80. Framingham Study LDL goal = < 160.  LP(a) 76 ( < 20).No FH of premature CAD/MI. Vitamin D deficiency (21 in 04/2006)  Past Surgical History: Reviewed history from 03/24/2010 and no changes  required. Carpal tunnel release RUE Colonoscopy  negative  2007; upper endoscopy  2007: hiatal hernia , Dr  Stan Head  Family History: Reviewed history from 03/24/2010 and no changes required. Father: ruptured AAA,elevated  lipids Mother: osteoporosis, lipids, colon adenomas, hypothyroidism Siblings: bro :DM,lipids   Social History: Reviewed history from 03/24/2010 and no changes required. no diet Occupation:Interventional Radiology Technician Never Smoked Alcohol use-yes: rarely Regular exercise-yes:3X/week X  30  min Physical Exam General appearance: well developed, well nourished, no acute distress Head: normocephalic, atraumatic Pupils: PERRL EOMI Ears: normal, no lesions or deformities Nasal: mucosa pink, nonedematous, no septal deviation, turbinates normal Oral/Pharynx: tongue normal, posterior pharynx without erythema or exudate Chest/Lungs: no rales, wheezes, or rhonchi bilateral, breath sounds equal without effort Heart: regular rate and  rhythm, no murmur Abdomen: soft, non-tender without obvious organomegaly Neurological: CN2-12 intact, normal strength distally, normal gait Back: no CVA tenderness MSE: oriented to time, place, and person Assessment New Problems: GASTROENTERITIS (ICD-558.9) DEHYDRATION (ICD-276.51)  after IVF, pt feels better, less weak  Plan New Orders: New Patient Level IV [99204] IV Fluids 1st hr.- Non Medicare [96360] T-CBC w/Diff [[id number]] Planning Comments:   Gave her 1L NS IVF.  She may have mild dehydration from loose stools and not drinking as much vs from a viral gastroenteritis.  A CBC was performed (Hgb 10.5, Plt 159, WBC 11.3 - Gr 79%, Ly 15%).  No meds given since not very nauseated and no signs of bacterial cause.  Encourage BRAT diet and hydration with water and gatorade.  If not improving, follow up with your PCP.  If worsening, can consider antibiotics or repeating CBC or checking UA, etc.  As of today, symptoms are vague  enough to try conservative treatment for a few days.   The patient and/or caregiver has been counseled thoroughly with regard to medications prescribed including dosage, schedule, interactions, rationale for use, and possible side effects and they verbalize understanding.  Diagnoses and expected course of recovery discussed and will return if not improved as expected or if the condition worsens. Patient and/or caregiver verbalized understanding.   Orders Added: 1)  New Patient Level IV [99204] 2)  IV Fluids 1st hr.- Non Medicare [96360] 3)  T-CBC w/Diff [[id number]]

## 2011-03-23 NOTE — Telephone Encounter (Signed)
  Phone Note Outgoing Call Call back at Home Phone [phone] P [facility]     Call placed by: Lajean Saver RN,  October 21, 2010 2:40 PM Call placed to: Patient Summary of Call: Callback: No answer. Message left with reason for call and call with questions or concerns Initial call taken by: Lajean Saver RN,  October 21, 2010 2:40 PM

## 2011-03-27 ENCOUNTER — Other Ambulatory Visit: Payer: Self-pay | Admitting: Internal Medicine

## 2011-04-01 ENCOUNTER — Other Ambulatory Visit: Payer: Self-pay | Admitting: Internal Medicine

## 2011-04-17 ENCOUNTER — Other Ambulatory Visit: Payer: Self-pay | Admitting: Gynecology

## 2011-04-17 DIAGNOSIS — Z1231 Encounter for screening mammogram for malignant neoplasm of breast: Secondary | ICD-10-CM

## 2011-05-26 ENCOUNTER — Encounter: Payer: 59 | Admitting: Internal Medicine

## 2011-05-27 ENCOUNTER — Ambulatory Visit (INDEPENDENT_AMBULATORY_CARE_PROVIDER_SITE_OTHER): Payer: 59 | Admitting: Internal Medicine

## 2011-05-27 ENCOUNTER — Other Ambulatory Visit: Payer: Self-pay | Admitting: Internal Medicine

## 2011-05-27 ENCOUNTER — Encounter: Payer: Self-pay | Admitting: Internal Medicine

## 2011-05-27 VITALS — BP 116/70 | HR 68 | Temp 98.5°F | Resp 12 | Ht 63.03 in | Wt 125.4 lb

## 2011-05-27 DIAGNOSIS — E782 Mixed hyperlipidemia: Secondary | ICD-10-CM

## 2011-05-27 DIAGNOSIS — Z832 Family history of diseases of the blood and blood-forming organs and certain disorders involving the immune mechanism: Secondary | ICD-10-CM

## 2011-05-27 DIAGNOSIS — Z0000 Encounter for general adult medical examination without abnormal findings: Secondary | ICD-10-CM

## 2011-05-27 NOTE — Patient Instructions (Signed)
Preventive Health Care: Exercise  30-45  minutes a day, 3-4 days a week. Walking is especially valuable in preventing Osteoporosis. Eat a low-fat diet with lots of fruits and vegetables, up to 7-9 servings per day. Consume less than 30 grams of sugar per day from foods & drinks with High Fructose Corn Syrup as # 1,2,3 or #4 on label.  Please  schedule fasting Labs : BMET,Lipids, hepatic panel, CBC & dif, TSH, vit D level, Leiden Factor V level. PLEASE BRING THESE INSTRUCTIONS TO FOLLOW UP  LAB APPOINTMENT.This will guarantee correct labs are drawn, eliminating need for repeat blood sampling ( needle sticks ! ). Diagnoses /Codes: V70.0, V18.3. Normal T scores on a bone density exam (BMD)  are +1 to -1. Osteopenia would be -1.1 to -2.4. Osteoporosis is defined by a  T score worse than -2.4. Treatment should be considered  with  T scores worse than -1.5, particularly if there is  family history of low bone density or personal  past history of atypical fractue.BMD should be monitored every 25 months. Recommended lifestyle interventions to prevent  Osteoporosis include calcium 600 mg twice a day  & vitamin D3 supplementation to keep vitamin  D  level @ least 40-60. The usual vitamin D3 dose is 1000 IU daily; but individual dose is determined by annual vitamin D level monitor.

## 2011-05-27 NOTE — Progress Notes (Signed)
Subjective:    Patient ID: LENZIE MONTESANO, female    DOB: Dec 08, 1955, 56 y.o.   MRN: [id number]  HPI  Mrs. Vanderhoef is here for a physical;she denies acute issues.       Review of Systems Patient reports no significant  vision/ hearing  changes, adenopathy,fever, weight change,  persistant / recurrent hoarseness , swallowing issues, chest pain,palpitations,edema,persistant /recurrent cough, hemoptysis, dyspnea( rest/ exertional/paroxysmal nocturnal), gastrointestinal bleeding(melena, rectal bleeding), abdominal pain, significant heartburn,  bowel changes,GU symptoms(dysuria, hematuria,pyuria, incontinence), Gyn symptoms(abnormal  bleeding , pain),  syncope, focal weakness, memory loss,numbness & tingling, skin/hair /nail changes,abnormal bruising or bleeding, anxiety,or depression.       Objective:   Physical Exam Gen.: Thin but healthy and well-nourished in appearance. Alert, appropriate and cooperative throughout exam. Head: Normocephalic without obvious abnormalities  Eyes: No corneal or conjunctival inflammation noted. Pupils equal round reactive to light and accommodation. Fundal exam is benign without hemorrhages, exudate, papilledema. Extraocular motion intact. Vision grossly normal with lenses. Ears: External  ear exam reveals no significant lesions or deformities. Canals clear .TMs normal. Hearing is grossly normal bilaterally. Nose: External nasal exam reveals no deformity or inflammation. Nasal mucosa are pink and moist. No lesions or exudates noted.  Mouth: Oral mucosa and oropharynx reveal no lesions or exudates. Teeth in good repair. Neck: No deformities, masses, or tenderness noted. Range of motion essentially normal. Thyroid normal Lungs: Normal respiratory effort; chest expands symmetrically. Lungs are clear to auscultation without rales, wheezes, or increased work of breathing. Heart: Normal rate and rhythm. Normal S1 and S2. No gallop, click, or rub. S4 with slurring at  LSB ;no  murmur. Abdomen: Bowel sounds normal; abdomen soft and nontender. No masses, organomegaly or hernias noted. Genitalia: Dr Chevis Pretty.                                                                                   Musculoskeletal/extremities: No deformity or scoliosis noted of  the thoracic or lumbar spine. No clubbing, cyanosis, edema, or deformity noted. Range of motion  normal .Tone & strength  normal.Joints normal. Nail health  good. Vascular: Carotid, radial artery, dorsalis pedis and  posterior tibial pulses are full and equal. No bruits present. Neurologic: Alert and oriented x3. Deep tendon reflexes symmetrical and normal.          Skin: Intact without suspicious lesions or rashes. Lymph: No cervical, axillary lymphadenopathy present. Psych: Mood and affect are normal. Normally interactive                                                                                         Assessment & Plan:  #1 comprehensive physical exam; no acute findings #2 see Problem List with Assessments & Recommendations  #3 family history of clotting abnormality with deep venous thrombosis (Leiden factor V deficiency) Plan: see Orders

## 2011-05-28 ENCOUNTER — Other Ambulatory Visit: Payer: 59

## 2011-06-01 ENCOUNTER — Telehealth: Payer: Self-pay

## 2011-06-01 NOTE — Telephone Encounter (Signed)
Message left on voicemail: [facility] Patient will call to schedule appointment we just need to fax order over  Dr.Hopper please place order and close encounter

## 2011-06-02 ENCOUNTER — Other Ambulatory Visit: Payer: Self-pay | Admitting: Internal Medicine

## 2011-06-02 DIAGNOSIS — M8580 Other specified disorders of bone density and structure, unspecified site: Secondary | ICD-10-CM

## 2011-06-08 ENCOUNTER — Other Ambulatory Visit: Payer: Self-pay | Admitting: Internal Medicine

## 2011-06-08 DIAGNOSIS — E559 Vitamin D deficiency, unspecified: Secondary | ICD-10-CM

## 2011-06-08 DIAGNOSIS — M899 Disorder of bone, unspecified: Secondary | ICD-10-CM

## 2011-06-08 NOTE — Telephone Encounter (Signed)
I attempted to enter the orderfor BMD; it is in the computer as having been placed 06/02/11. Dx: osteopenia and vitamin D deficiency

## 2011-06-08 NOTE — Telephone Encounter (Signed)
Renee please verify if this order was already faxed. Per Dr.Hopper order already entered

## 2011-06-08 NOTE — Telephone Encounter (Signed)
Order is in the system.  I spoke with patient, made her aware, she will now call and schedule her mmg & bone density.

## 2011-06-08 NOTE — Telephone Encounter (Signed)
Dr.Hopper please place order for BMD

## 2011-06-15 ENCOUNTER — Other Ambulatory Visit (HOSPITAL_COMMUNITY): Payer: Self-pay | Admitting: Gynecology

## 2011-06-15 DIAGNOSIS — Z1231 Encounter for screening mammogram for malignant neoplasm of breast: Secondary | ICD-10-CM

## 2011-06-16 ENCOUNTER — Ambulatory Visit: Payer: 59

## 2011-06-18 ENCOUNTER — Other Ambulatory Visit (INDEPENDENT_AMBULATORY_CARE_PROVIDER_SITE_OTHER): Payer: 59

## 2011-06-18 DIAGNOSIS — Z832 Family history of diseases of the blood and blood-forming organs and certain disorders involving the immune mechanism: Secondary | ICD-10-CM

## 2011-06-18 DIAGNOSIS — Z0000 Encounter for general adult medical examination without abnormal findings: Secondary | ICD-10-CM

## 2011-06-18 LAB — CBC WITH DIFFERENTIAL/PLATELET
Eosinophils Relative: 2.9 % (ref 0.0–5.0)
Monocytes Relative: 8.2 % (ref 3.0–12.0)
Neutrophils Relative %: 52.3 % (ref 43.0–77.0)
Platelets: 216 10*3/uL (ref 150.0–400.0)
WBC: 5.2 10*3/uL (ref 4.5–10.5)

## 2011-06-18 LAB — BASIC METABOLIC PANEL
CO2: 28 mEq/L (ref 19–32)
Calcium: 8.9 mg/dL (ref 8.4–10.5)
GFR: 87.48 mL/min (ref >60.00)
Sodium: 136 mEq/L (ref 135–145)

## 2011-06-18 LAB — HEPATIC FUNCTION PANEL
ALT: 38 U/L — ABNORMAL HIGH (ref 0–35)
AST: 35 U/L (ref 0–37)
Albumin: 3.8 g/dL (ref 3.5–5.2)
Total Protein: 7.1 g/dL (ref 6.0–8.3)

## 2011-06-18 LAB — LIPID PANEL
HDL: 53.7 mg/dL (ref >39.00)
Triglycerides: 80 mg/dL (ref 0.0–149.0)

## 2011-06-18 LAB — TSH: TSH: 2.27 u[IU]/mL (ref 0.35–5.50)

## 2011-06-19 LAB — VITAMIN D 25 HYDROXY (VIT D DEFICIENCY, FRACTURES): Vit D, 25-Hydroxy: 72 ng/mL (ref 30–89)

## 2011-06-23 ENCOUNTER — Other Ambulatory Visit: Payer: Self-pay | Admitting: Internal Medicine

## 2011-06-23 DIAGNOSIS — D6851 Activated protein C resistance: Secondary | ICD-10-CM | POA: Insufficient documentation

## 2011-06-29 ENCOUNTER — Other Ambulatory Visit: Payer: Self-pay | Admitting: Internal Medicine

## 2011-06-29 NOTE — Telephone Encounter (Signed)
Prescription sent to pharmacy.

## 2011-06-30 ENCOUNTER — Telehealth: Payer: Self-pay | Admitting: Oncology

## 2011-06-30 NOTE — Telephone Encounter (Signed)
Called pt ,left message to call us back to confirm appt. °

## 2011-07-03 ENCOUNTER — Telehealth: Payer: Self-pay | Admitting: Oncology

## 2011-07-03 NOTE — Telephone Encounter (Signed)
Talked to pt, she is aware of appt of appt and location.

## 2011-07-06 ENCOUNTER — Telehealth: Payer: Self-pay | Admitting: Oncology

## 2011-07-06 NOTE — Telephone Encounter (Signed)
Referred by Dr.William Alwyn Ren Dx- Factor V Risk

## 2011-07-09 ENCOUNTER — Ambulatory Visit (HOSPITAL_COMMUNITY)
Admission: RE | Admit: 2011-07-09 | Discharge: 2011-07-09 | Disposition: A | Discharge status: Home / Self Care | Payer: 59 | Source: Ambulatory Visit | Attending: Gynecology | Admitting: Gynecology

## 2011-07-09 ENCOUNTER — Ambulatory Visit (HOSPITAL_COMMUNITY)
Admission: RE | Admit: 2011-07-09 | Discharge: 2011-07-09 | Disposition: A | Discharge status: Home / Self Care | Payer: 59 | Source: Ambulatory Visit | Attending: Internal Medicine | Admitting: Internal Medicine

## 2011-07-09 DIAGNOSIS — Z1231 Encounter for screening mammogram for malignant neoplasm of breast: Secondary | ICD-10-CM | POA: Insufficient documentation

## 2011-07-09 DIAGNOSIS — E559 Vitamin D deficiency, unspecified: Secondary | ICD-10-CM

## 2011-07-09 DIAGNOSIS — M949 Disorder of cartilage, unspecified: Secondary | ICD-10-CM

## 2011-07-09 DIAGNOSIS — M899 Disorder of bone, unspecified: Secondary | ICD-10-CM

## 2011-07-13 ENCOUNTER — Other Ambulatory Visit: Payer: Self-pay | Admitting: Oncology

## 2011-07-13 ENCOUNTER — Encounter: Payer: Self-pay | Admitting: Oncology

## 2011-07-13 ENCOUNTER — Telehealth: Payer: Self-pay | Admitting: *Deleted

## 2011-07-13 DIAGNOSIS — R7401 Elevation of levels of liver transaminase levels: Secondary | ICD-10-CM

## 2011-07-13 DIAGNOSIS — D6851 Activated protein C resistance: Secondary | ICD-10-CM

## 2011-07-13 DIAGNOSIS — D509 Iron deficiency anemia, unspecified: Secondary | ICD-10-CM

## 2011-07-13 HISTORY — DX: Elevation of levels of liver transaminase levels: R74.01

## 2011-07-13 HISTORY — DX: Iron deficiency anemia, unspecified: D50.9

## 2011-07-13 NOTE — Telephone Encounter (Signed)
per orders patient was scheduled on 07-03-2011 for the 07-15-2011

## 2011-07-15 ENCOUNTER — Ambulatory Visit (HOSPITAL_BASED_OUTPATIENT_CLINIC_OR_DEPARTMENT_OTHER): Payer: 59 | Admitting: Oncology

## 2011-07-15 ENCOUNTER — Ambulatory Visit: Payer: 59

## 2011-07-15 ENCOUNTER — Other Ambulatory Visit (HOSPITAL_BASED_OUTPATIENT_CLINIC_OR_DEPARTMENT_OTHER): Payer: 59 | Admitting: Lab

## 2011-07-15 ENCOUNTER — Telehealth: Payer: Self-pay | Admitting: *Deleted

## 2011-07-15 VITALS — BP 126/69 | HR 59 | Temp 97.1°F | Ht 63.0 in | Wt 123.2 lb

## 2011-07-15 DIAGNOSIS — D6851 Activated protein C resistance: Secondary | ICD-10-CM

## 2011-07-15 DIAGNOSIS — D509 Iron deficiency anemia, unspecified: Secondary | ICD-10-CM

## 2011-07-15 DIAGNOSIS — D6859 Other primary thrombophilia: Secondary | ICD-10-CM

## 2011-07-15 DIAGNOSIS — R7401 Elevation of levels of liver transaminase levels: Secondary | ICD-10-CM

## 2011-07-15 LAB — CBC & DIFF AND RETIC
Basophils Absolute: 0 10*3/uL (ref 0.0–0.1)
EOS%: 1.9 % (ref 0.0–7.0)
Eosinophils Absolute: 0.1 10*3/uL (ref 0.0–0.5)
HCT: 35.4 % (ref 34.8–46.6)
HGB: 11.4 g/dL — ABNORMAL LOW (ref 11.6–15.9)
Immature Retic Fract: 7.2 % (ref 1.60–10.00)
MCH: 24.3 pg — ABNORMAL LOW (ref 25.1–34.0)
NEUT#: 2.9 10*3/uL (ref 1.5–6.5)
NEUT%: 54.3 % (ref 38.4–76.8)
lymph#: 1.8 10*3/uL (ref 0.9–3.3)

## 2011-07-15 LAB — MORPHOLOGY
PLT EST: ADEQUATE
RBC Comments: NORMAL

## 2011-07-15 NOTE — Progress Notes (Signed)
New Patient Hematology-Oncology Evaluation   Shannon West [id number] May 12, 1955 56 y.o. 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 One of her brothers had a deep venous thrombosis 2 years ago at age 20 and was found to carry the factor V Leiden gene mutation. That same brother suffered a sudden death syndrome one year later. He developed a hacking cough, went to bed, never woke up. No autopsy. Not on anticoagulation at time of his death.  Shannon West has never had a thrombotic event. She was on oral contraceptives for a number of years. She had 2 pregnancies without any complications. Her only surgical procedure was for a right carpal tunnel syndrome. She has a daughter age 54 who had a C-section following a normal pregnancy and did not have any a clotting problems. Her son is 53 years old and has no clotting problems. Her father died at age 62 of a ruptured abdominal aortic aneurysm. Her mother is still alive and well with no clotting history.   PMH: Past Medical History  Diagnosis Date  . History of anemia   . GERD (gastroesophageal reflux disease)   . Osteopenia   . Hyperlipemia   . Vitamin d deficiency     (04/2006)  . Hypochromic-microcytic anemia 07/13/2011    Hb 10.9 MCV 83.5 06/18/11  . Transaminitis 07/13/2011    Borderline  SGPT 38  06/18/11 normal SGOT    Past Surgical History  Procedure Date  . Carpal tunnel release     RUE  . Colonoscopy 2007    Negative  . Upper gastrointestinal endoscopy 2007    hiatal hernia  . Hiatal hernia repair     Dr.Carl Leone Payor    Allergies: No Known Allergies  Medications: Medications Prior to Admission  Medication Sig Dispense Refill  . alendronate (FOSAMAX) 70 MG tablet TAKE 1  TABLET BY MOUTH WEEKLY AS DIRECTED  12 tablet  3  . calcium carbonate (OS-CAL) 600 MG TABS Take 600 mg by mouth daily.       . Cetirizine-Pseudoephedrine (ZYRTEC-D ALLERGY & CONGESTION PO) Take by mouth as needed.        Marland Kitchen ibuprofen (ADVIL,MOTRIN) 200 MG tablet Take 200 mg by mouth as needed.        Marland Kitchen NEXIUM 40 MG capsule TAKE 1 CAPSULE BY MOUTH DAILY  90 capsule  1  . NIASPAN 1000 MG CR tablet TAKE 1 TABLET BY MOUTH DAILY AS DIRECTED  30 tablet  11  . Omega-3 Fatty Acids (FISH OIL) 1000 MG CAPS Take by mouth daily.      . Vitamin D, Ergocalciferol, (DRISDOL) 50000 UNITS CAPS Take 50,000 Units by mouth every other day.       No current facility-administered medications on file as of 07/15/2011.    Social History:   reports that she has never smoked. She does not have any smokeless tobacco history on file. She reports that she does not drink alcohol or use illicit drugs.  Family History: Family History  Problem Relation Age of Onset  . Hyperlipidemia Father   . Osteoporosis Mother   . Hyperlipidemia Mother   . Hypothyroidism Mother   . Diabetes Brother     died in sleep in context of pulmonary infection @ 28  . Hyperlipidemia Brother   .  Clotting disorder Brother     factor 5 deficiency with DVT    Review of Systems: Constitutional symptoms: No constitutional symptoms HEENT: Respiratory:  Cardiovascular:  Known hyperlipidemia on Niaspan Gastrointestinal ROS: Occasional reflux on Nexium Genito-Urinary ROS:  Hematological and Lymphatic: Chronic microcytic anemia. She has had colonoscopies at schedule times and has not had any obvious pathology Musculoskeletal: Osteopenia on calcium and Fosamax and vitamin D Neurologic: Dermatologic: Remaining ROS negative.  Physical Exam: Blood pressure 126/69, pulse 59, temperature 97.1 F (36.2 C), temperature source Oral, height 5\' 3"  (1.6 m), weight 123 lb 3.2 oz (55.883 kg). Wt Readings from Last 3 Encounters:  07/15/11 123 lb 3.2 oz  (55.883 kg)  05/27/11 125 lb 6.4 oz (56.881 kg)  11/06/10 122 lb 12.8 oz (55.702 kg)    General appearance: Thin Caucasian woman Head: Normal Neck: Oropharynx no erythema or exudate, no thyromegaly, Lymph nodes: No cervical or axillary adenopathy Breasts: Not examined Lungs: Clear to auscultation resonant to percussion Heart: Regular rhythm no murmur Abdominal: Soft nontender no mass no organomegaly GU: Not examined Extremities: No edema no calf tenderness. Calf circumference symmetric 34.5 cm bilaterally Neurologic: Pupils equal reactive to light optic discs sharp vessels normal, motor strength 5 over 5, reflexes 2+ symmetric Skin: No rash    Lab Results: Lab Results  Component Value Date   WBC 5.3 07/15/2011   HGB 11.4* 07/15/2011   HCT 35.4 07/15/2011   MCV 75.3* 07/15/2011   PLT 210 07/15/2011     Chemistry      Component Value Date/Time   NA 136 06/18/2011 0835   K 3.9 06/18/2011 0835   CL 102 06/18/2011 0835   CO2 28 06/18/2011 0835   BUN 18 06/18/2011 0835   CREATININE 0.7 06/18/2011 0835      Component Value Date/Time   CALCIUM 8.9 06/18/2011 0835   ALKPHOS 56 06/18/2011 0835   AST 35 06/18/2011 0835   ALT 38* 06/18/2011 0835   BILITOT 0.4 06/18/2011 0835       Review of peripheral blood: Normochromic microcytic red cells, mature neutrophils and lymphocytes, platelets with normal morphology    Impression and Plan: #1. Factor. V Leiden heterozygote status with no personal history of clotting despite multiple risk factors including previous use of oral contraceptives and 2 pregnancies.  Patient along with her husband were educated about the clotting risk associated with factor V Leiden status which in fact is quite minimal  increasing risk 4 to fivefold over  normal and to the same degree as the risk of either being pregnant or being on an oral contraceptive. The factor V Leiden does become important when combined with other traditional risk factors for clotting such as  infection, immobilization, or surgery. I informed her that anyone admitted to the hospital now a days receives prophylactic anticoagulation whether or not they have a genetic risk factor for clotting. She would not need any additional anticoagulation above and beyond the usual prophylaxis. Sometimes, if a patient has major surgery, I might extend prophylaxis for 2 weeks postop until the patient is fully ambulatory at home and the wound has healed. She is advised to avoid estrogens since we know there is a negative interaction which may. magnify  clotting risk in patients with the Leiden gene. Since the Leiden gene is so common, I am going to check her for the prothrombin gene mutation since the combined disorder is common and may impact on anticoagulation recommendations for the future.  There are currently no data  to support the use of  prophylactic anticoagulation even in patients who have had a prior clot who not currently on anticoagulation. However, anecdotally, I think the risk of taking an 81 mg dose of aspirin his low and may have some preventive effect. 2 recent studies in the literature support some prophylactic activity against recurrent venous thrombosis in patients who have had a prior venous clots who then go on maintenance aspirin. There is an approximate 30% risk reduction. Whether we can extrapolate this data to somebody who has not had a prior clot but has a congenital a risk factor for clotting is not established at this time. I will leave the decision whether or not to take a baby aspirin up to the patient.  #2. Microcytic anemia. Likely iron deficient. No clinical bleeding. Current on colonoscopies. I advised her to go on iron sulfate 3 times a day.      Levert Feinstein, MD 07/15/2011, 12:49 PM

## 2011-07-15 NOTE — Telephone Encounter (Signed)
per orders on 07-15-2011 patient to come back when needed ( PRN)

## 2011-07-17 LAB — HEPATIC FUNCTION PANEL
Bilirubin, Direct: 0.1 mg/dL (ref 0.0–0.3)
Indirect Bilirubin: 0.2 mg/dL (ref 0.0–0.9)
Total Bilirubin: 0.3 mg/dL (ref 0.3–1.2)

## 2011-07-17 LAB — IRON AND TIBC: UIBC: 364 ug/dL (ref 125–400)

## 2011-07-17 LAB — D-DIMER, QUANTITATIVE: D-Dimer, Quant: 0.84 ug/mL-FEU — ABNORMAL HIGH (ref 0.00–0.48)

## 2011-07-17 LAB — PROTHROMBIN GENE MUTATION

## 2011-07-31 ENCOUNTER — Encounter: Payer: Self-pay | Admitting: Internal Medicine

## 2011-11-13 ENCOUNTER — Other Ambulatory Visit: Payer: Self-pay | Admitting: Internal Medicine

## 2011-12-31 ENCOUNTER — Telehealth: Payer: Self-pay | Admitting: Internal Medicine

## 2011-12-31 MED ORDER — NIACIN ER (ANTIHYPERLIPIDEMIC) 1000 MG PO TBCR: EXTENDED_RELEASE_TABLET | ORAL | Status: DC

## 2011-12-31 MED ORDER — ESOMEPRAZOLE MAGNESIUM 40 MG PO CPDR: DELAYED_RELEASE_CAPSULE | ORAL | Status: DC

## 2011-12-31 MED ORDER — ALENDRONATE SODIUM 70 MG PO TABS: ORAL_TABLET | ORAL | Status: DC

## 2011-12-31 NOTE — Telephone Encounter (Signed)
RXs sent.

## 2011-12-31 NOTE — Telephone Encounter (Signed)
PT called left mess on triage line 242pm switching pharmacy's needs rx sent to [facility] [address] for the following:  nexium 40mg   Generic fosamax 70mg  niaspan 1000mg    Pt noted she needs next wk cb# [phone]  Last ov 2.6.13 V70

## 2012-01-06 ENCOUNTER — Telehealth: Payer: Self-pay | Admitting: Internal Medicine

## 2012-01-06 NOTE — Telephone Encounter (Signed)
I called patient and left message for her to contact the insurance company to get a copy of her formulary and schedule an appointment for medications to be changed. (Per MD request)

## 2012-01-06 NOTE — Telephone Encounter (Signed)
Patient states the medications we prescribed total $440 per month. She is asking if we can substitute anything less expensive for any of them. She also states she has been on fosamax for close to 5 years and she thinks that's the maximum and may be able to stop taking it. Please advise.

## 2012-01-27 ENCOUNTER — Ambulatory Visit (INDEPENDENT_AMBULATORY_CARE_PROVIDER_SITE_OTHER): Payer: PRIVATE HEALTH INSURANCE | Admitting: Internal Medicine

## 2012-01-27 ENCOUNTER — Encounter: Payer: Self-pay | Admitting: Internal Medicine

## 2012-01-27 VITALS — BP 110/70 | HR 83 | Wt 124.8 lb

## 2012-01-27 DIAGNOSIS — E782 Mixed hyperlipidemia: Secondary | ICD-10-CM

## 2012-01-27 DIAGNOSIS — K219 Gastro-esophageal reflux disease without esophagitis: Secondary | ICD-10-CM

## 2012-01-27 DIAGNOSIS — M949 Disorder of cartilage, unspecified: Secondary | ICD-10-CM

## 2012-01-27 DIAGNOSIS — E559 Vitamin D deficiency, unspecified: Secondary | ICD-10-CM

## 2012-01-27 DIAGNOSIS — D6851 Activated protein C resistance: Secondary | ICD-10-CM

## 2012-01-27 DIAGNOSIS — D6859 Other primary thrombophilia: Secondary | ICD-10-CM

## 2012-01-27 DIAGNOSIS — M899 Disorder of bone, unspecified: Secondary | ICD-10-CM

## 2012-01-27 MED ORDER — OMEPRAZOLE 20 MG PO CPDR: 20.0000 mg | DELAYED_RELEASE_CAPSULE | Freq: Every day | ORAL | Status: DC

## 2012-01-27 MED ORDER — PRAVASTATIN SODIUM 20 MG PO TABS: ORAL_TABLET | ORAL | Status: DC

## 2012-01-27 MED ORDER — ALENDRONATE SODIUM 70 MG PO TABS: 70.0000 mg | ORAL_TABLET | ORAL | Status: DC

## 2012-01-27 NOTE — Assessment & Plan Note (Signed)
Alendronate will be continued for an additional 12 months, for a total of 5 years. Bone density will be due in April of 2015

## 2012-01-27 NOTE — Assessment & Plan Note (Signed)
In view of the results of the AIM HIGH study and based on discussion with several Lipidologists; low-dose pravastatin would be recommended in place of the Niaspan

## 2012-01-27 NOTE — Assessment & Plan Note (Signed)
She'll be changed to omeprazole 20 mg daily because of the cost of Nexium

## 2012-01-27 NOTE — Progress Notes (Signed)
  Subjective:    Patient ID: Shannon West, female    DOB: 1955-06-13, 56 y.o.   MRN: [id number]  HPI She is here to discuss changing medications. She is on Niaspan because of an elevated Lp (a) and elevated LDL. This product cost $185 a month  She's been on Nexium for reflux; the cost of this drug is $219.  Her Fosamax because approximately $40 a month.  Her problem list was assessed and updated.    Review of Systems  Her reflux is quiescent. She specifically denies hoarseness, dysphagia, abdominal pain, unexplained weight loss, melena, rectal bleeding.  Her bone density is up to date. She has been on Fosamax for 4 years; this should be continued for an additional 12 months. Her vitamin D level is now therapeutic.       Objective:   Physical Exam General appearance:thin but well nourished; no acute distress or increased work of breathing is present.  No  lymphadenopathy about the head, neck, or axilla noted.   Eyes: No conjunctival inflammation or lid edema is present. There is no scleral icterus.  Oral exam: Dental hygiene is good; lips and gums are healthy appearing.There is no oropharyngeal erythema or exudate noted.   Neck:  No deformities, thyromegaly, masses, or tenderness noted.   Supple with full range of motion without pain.   Heart:  Normal rate and regular rhythm. S1 and S2 normal without gallop, murmur, click, rub or other extra sounds.   Lungs:Chest clear to auscultation; no wheezes, rhonchi,rales ,or rubs present.No increased work of breathing.    Abdomen: bowel sounds normal, soft and non-tender without masses, organomegaly or hernias noted.  No guarding or rebound    All pulses intact without  bruits .No ischemic skin changes.    Extremities:  No cyanosis, edema, or clubbing  noted    Skin: Warm & dry w/o jaundice or tenting.          Assessment & Plan:

## 2012-01-27 NOTE — Patient Instructions (Addendum)
Please review Dr Gildardo Griffes book Eat, Drink & Be Healthy for dietary cholesterol information.  Please  schedule fasting Labs in 10 weeks :  Lipid Panel , CK, hepatic panel. PLEASE BRING THESE INSTRUCTIONS TO FOLLOW UP  LAB APPOINTMENT.This will guarantee correct labs are drawn, eliminating need for repeat blood sampling ( needle sticks ! ). Diagnoses /Codes: 272.4,995.20  If you activate My Chart; the results can be released to you as soon as they populate from the lab. If you choose not to use this program; the labs have to be reviewed, copied & mailed   causing a delay in getting the results to you.

## 2012-04-18 ENCOUNTER — Other Ambulatory Visit (INDEPENDENT_AMBULATORY_CARE_PROVIDER_SITE_OTHER): Payer: PRIVATE HEALTH INSURANCE

## 2012-04-18 ENCOUNTER — Encounter: Payer: Self-pay | Admitting: *Deleted

## 2012-04-18 DIAGNOSIS — T887XXA Unspecified adverse effect of drug or medicament, initial encounter: Secondary | ICD-10-CM

## 2012-04-18 DIAGNOSIS — E785 Hyperlipidemia, unspecified: Secondary | ICD-10-CM

## 2012-04-18 LAB — LIPID PANEL
HDL: 41.9 mg/dL (ref >39.00)
LDL Cholesterol: 95 mg/dL (ref 0–99)
Total CHOL/HDL Ratio: 4
Triglycerides: 88 mg/dL (ref 0.0–149.0)
VLDL: 17.6 mg/dL (ref 0.0–40.0)

## 2012-04-18 LAB — HEPATIC FUNCTION PANEL
Albumin: 3.8 g/dL (ref 3.5–5.2)
Total Protein: 7.5 g/dL (ref 6.0–8.3)

## 2012-04-18 LAB — CK: Total CK: 54 U/L (ref 7–177)

## 2012-04-28 ENCOUNTER — Other Ambulatory Visit: Payer: Self-pay | Admitting: Internal Medicine

## 2012-11-24 ENCOUNTER — Other Ambulatory Visit: Payer: Self-pay | Admitting: Gynecology

## 2012-11-24 DIAGNOSIS — Z1231 Encounter for screening mammogram for malignant neoplasm of breast: Secondary | ICD-10-CM

## 2012-12-01 ENCOUNTER — Ambulatory Visit: Payer: PRIVATE HEALTH INSURANCE

## 2012-12-08 ENCOUNTER — Ambulatory Visit: Payer: PRIVATE HEALTH INSURANCE

## 2013-01-26 ENCOUNTER — Encounter: Payer: PRIVATE HEALTH INSURANCE | Admitting: Internal Medicine

## 2013-02-15 ENCOUNTER — Ambulatory Visit (INDEPENDENT_AMBULATORY_CARE_PROVIDER_SITE_OTHER): Payer: PRIVATE HEALTH INSURANCE | Admitting: Gynecology

## 2013-02-15 ENCOUNTER — Encounter: Payer: Self-pay | Admitting: Gynecology

## 2013-02-15 VITALS — BP 118/72 | HR 62 | Resp 16 | Ht 63.0 in | Wt 127.0 lb

## 2013-02-15 DIAGNOSIS — Z0000 Encounter for general adult medical examination without abnormal findings: Secondary | ICD-10-CM

## 2013-02-15 DIAGNOSIS — E7800 Pure hypercholesterolemia, unspecified: Secondary | ICD-10-CM | POA: Insufficient documentation

## 2013-02-15 DIAGNOSIS — Z01419 Encounter for gynecological examination (general) (routine) without abnormal findings: Secondary | ICD-10-CM

## 2013-02-15 DIAGNOSIS — Z124 Encounter for screening for malignant neoplasm of cervix: Secondary | ICD-10-CM

## 2013-02-15 LAB — POCT URINALYSIS DIPSTICK
Leukocytes, UA: NEGATIVE
Urobilinogen, UA: NEGATIVE

## 2013-02-15 NOTE — Patient Instructions (Signed)

## 2013-02-15 NOTE — Progress Notes (Signed)
57 y.o. Married Caucasian female   G2P2002 here for annual exam. Pt reports menses are absent. She does report hot flashes, does not have night sweats, does have vaginal dryness.  She is using lubricants. She does not report post-menopasual bleeding.  Pt reports flashes are tolerable, no rx needed.  Pt was on fosamax, self d/c'd this summer, after 5y, no intolerance-no osteoporosis-no change after 5y of treatment.  Patient's last menstrual period was 02/16/1999.          Sexually active: yes  The current method of family planning is post menopausal status and Husband has Vasectomy  Exercising: yes  [employer] 2-3x/wk; power walk in between Last pap: 07/2011 Abnormal PAP: no Mammogram: 07/09/11 Bi Rads 1  BSE: no Colonoscopy: 2007- Normal  DEXA: 07/09/2011 Alcohol:  no Tobacco: no  Hgb: PCP; Urine: Negative  Health Maintenance  Topic Date Due  . Pap Smear  11/28/1972  . Colonoscopy  11/28/2004  . Influenza Vaccine  11/18/2012  . Mammogram  07/08/2013  . Tetanus/tdap  04/20/2014    Family History  Problem Relation Age of Onset  . Hyperlipidemia Father   . Osteoporosis Mother   . Hyperlipidemia Mother   . Hypothyroidism Mother   . Diabetes Brother     died in sleep in context of pulmonary infection @ 57  . Hyperlipidemia Brother   . Clotting disorder Brother     factor 5 deficiency with DVT  . Diabetes Maternal Grandmother     Patient Active Problem List   Diagnosis Date Noted  . High cholesterol 02/15/2013  . 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 Transaminitis 07/13/2011  . Factor 5 Leiden mutation, heterozygous 06/23/2011  . VITAMIN D DEFICIENCY 03/24/2010  . HYPERLIPIDEMIA 03/03/2007  . GERD 09/07/2006  . OSTEOPENIA 09/07/2006    Past Medical History  Diagnosis Date  . History of anemia   . GERD (gastroesophageal reflux disease)   . Osteopenia   . Hyperlipemia   . Vitamin D deficiency     (04/2006)  . Hypochromic-microcytic anemia 07/13/2011    Hb  10.9 MCV 83.5 06/18/11  . Transaminitis 07/13/2011    Borderline  SGPT 38  06/18/11 normal SGOT  . Factor 5 Leiden mutation, heterozygous   . Clotting disorder     Past Surgical History  Procedure Laterality Date  . Carpal tunnel release Right     RUE  . Colonoscopy  2007    Negative  . Upper gastrointestinal endoscopy  2007    hiatal hernia    Allergies: Review of patient's allergies indicates no known allergies.  Current Outpatient Prescriptions  Medication Sig Dispense Refill  . aspirin EC 81 MG tablet Take 81 mg by mouth daily.      . calcium carbonate (OS-CAL) 600 MG TABS Take 600 mg by mouth 2 (two) times daily with a meal.       . Cholecalciferol (VITAMIN D) 2000 UNITS CAPS Take by mouth daily.      Marland Kitchen esomeprazole (NEXIUM) 20 MG capsule Take 20 mg by mouth daily before breakfast.      . Ferrous Sulfate (IRON) 325 (65 FE) MG TABS Take by mouth daily.      Marland Kitchen ibuprofen (ADVIL,MOTRIN) 200 MG tablet Take 200 mg by mouth as needed.        . Omega-3 Fatty Acids (FISH OIL) 1000 MG CAPS Take by mouth 2 (two) times daily.       Marland Kitchen alendronate (FOSAMAX) 70 MG tablet Take 1 tablet (70 mg total)  by mouth every 7 (seven) days. Take with a full glass of water on an empty stomach.  12 tablet  3  . pravastatin (PRAVACHOL) 20 MG tablet TAKE 1 TABLET BY MOUTH EVERY DAY  90 tablet  3   No current facility-administered medications for this visit.    ROS: Pertinent items are noted in HPI.  Exam:    BP 118/72  Pulse 62  Resp 16  Ht 5\' 3"  (1.6 m)  Wt 127 lb (57.607 kg)  BMI 22.5 kg/m2  LMP 02/16/1999 Weight change: @WEIGHTCHANGE @ Last 3 height recordings:  Ht Readings from Last 3 Encounters:  02/15/13 5\' 3"  (1.6 m)  07/15/11 5\' 3"  (1.6 m)  05/27/11 5' 3.02" (1.601 m)   General appearance: alert, cooperative and appears stated age Head: Normocephalic, without obvious abnormality, atraumatic Neck: no adenopathy, no carotid bruit, no JVD, supple, symmetrical, trachea midline and thyroid  not enlarged, symmetric, no tenderness/mass/nodules Lungs: clear to auscultation bilaterally Breasts: normal appearance, no masses or tenderness Heart: regular rate and rhythm, S1, S2 normal, no murmur, click, rub or gallop Abdomen: soft, non-tender; bowel sounds normal; no masses,  no organomegaly Extremities: extremities normal, atraumatic, no cyanosis or edema Skin: Skin color, texture, turgor normal. No rashes or lesions Lymph nodes: Cervical, supraclavicular, and axillary nodes normal. no inguinal nodes palpated Neurologic: Grossly normal   Pelvic: External genitalia:  no lesions              Urethra: normal appearing urethra with no masses, tenderness or lesions              Bartholins and Skenes: normal                 Vagina: atrophic              Cervix: normal appearance and os stenotic              Pap taken: yes        Bimanual Exam:  Uterus:  uterus is normal size, shape, consistency and nontender, deviated to right, slightly enlarged                                      Adnexa:    normal adnexa in size, nontender and no masses                                      Rectovaginal: Confirms                                      Anus:  normal sphincter tone, no lesions  A: well woman Menopausal Heterozygous for factor V     P: mammogram annual Enlarged uterus-normal u/s in 2010 no fibroids pap smear with HRHPV counseled on breast self exam, mammography screening, osteoporosis, adequate intake of calcium and vitamin D, diet and exercise return annually or prn Discussed PAP guideline changes, importance of weight bearing exercises, calcium, vit D and balanced diet.  An After Visit Summary was printed and given to the patient.

## 2013-02-28 ENCOUNTER — Telehealth: Payer: Self-pay

## 2013-02-28 NOTE — Telephone Encounter (Signed)
Medication and allergies: updated and reviewed  90 day supply/mail order: na Local pharmacy: [facility]   Immunizations due:  Will get flu vaccine at  [facility]  A/P:   No changes to FH or PSH CCS--2007 per patient--next 2017 Pap--Dr Lathrop--01/2013--neg MMG--06/2011-neg Dexa--06/2011 Tdap--04/2004  To Discuss with Provider: Patient stopped taking Fosamax over summer GERD medications

## 2013-03-01 ENCOUNTER — Ambulatory Visit (INDEPENDENT_AMBULATORY_CARE_PROVIDER_SITE_OTHER): Payer: PRIVATE HEALTH INSURANCE | Admitting: Internal Medicine

## 2013-03-01 ENCOUNTER — Encounter: Payer: Self-pay | Admitting: Internal Medicine

## 2013-03-01 VITALS — BP 107/70 | HR 76 | Temp 98.0°F | Ht 63.0 in | Wt 128.4 lb

## 2013-03-01 DIAGNOSIS — M899 Disorder of bone, unspecified: Secondary | ICD-10-CM

## 2013-03-01 DIAGNOSIS — E782 Mixed hyperlipidemia: Secondary | ICD-10-CM

## 2013-03-01 DIAGNOSIS — Z0000 Encounter for general adult medical examination without abnormal findings: Secondary | ICD-10-CM

## 2013-03-01 MED ORDER — PRAVASTATIN SODIUM 20 MG PO TABS: ORAL_TABLET | ORAL | Status: DC

## 2013-03-01 NOTE — Progress Notes (Signed)
Pre visit review using our clinic review tool, if applicable. No additional management support is needed unless otherwise documented below in the visit note. 

## 2013-03-01 NOTE — Patient Instructions (Signed)
Your next office appointment will be determined based upon review of your pending labs . Those instructions will be transmitted to you through My Chart   Use an anti-inflammatory cream such as Aspercreme or Zostrix cream twice a day to the affected area as needed. In lieu of this warm moist compresses or  hot water bottle can be used. Do not apply ice . Followup as needed for your DeQuervain's tenosynovitis  with Dr Ayesha Mohair. Please report any significant change in your symptoms.

## 2013-03-01 NOTE — Progress Notes (Signed)
  Subjective:    Patient ID: Shannon West, female    DOB: 1956-04-20, 57 y.o.   MRN: [id number]  HPI  She is here for a physical;acute issues include intermittent pain @ base of L thumb     Review of Systems A heart healthy diet is followed; exercise encompasses 90 minutes 2-3  times per week as elliptical, treadmill, & machines without symptoms.  Family history is ? Positive  for premature coronary disease in her brother. Advanced cholesterol testing reveals  LDL goal is less than 100 ; ideally <70 . There is medication compliance with the statin.  Low dose ASA taken Specifically denied are  chest pain, palpitations, dyspnea, or claudication.  Significant abdominal symptoms, memory deficit, or myalgias not present.     Objective:   Physical Exam  Gen.: Thin but healthy and well-nourished in appearance. Alert, appropriate and cooperative throughout exam.Appears younger than stated age  Head: Normocephalic without obvious abnormalities  Eyes: No corneal or conjunctival inflammation noted. Pupils equal round reactive to light and accommodation. Extraocular motion intact.  Ears: External  ear exam reveals no significant lesions or deformities. Canals clear .TMs normal. Hearing is grossly normal bilaterally. Nose: External nasal exam reveals no deformity or inflammation. Nasal mucosa are pink and moist. No lesions or exudates noted.  Mouth: Oral mucosa and oropharynx reveal no lesions or exudates. Teeth in good repair. Neck: No deformities, masses, or tenderness noted. Range of motion decreased. Thyroid small & slightly irregular. Lungs: Normal respiratory effort; chest expands symmetrically. Lungs are clear to auscultation without rales, wheezes, or increased work of breathing. Heart: Normal rate and rhythm. Normal S1 and S2. No gallop, click, or rub. No murmur. Abdomen: Bowel sounds normal; abdomen soft and nontender. No masses, organomegaly or hernias noted. Genitalia: as per Gyn                                   Musculoskeletal/extremities: No deformity or scoliosis noted of  the thoracic or lumbar spine.  No clubbing, cyanosis, edema, or significant extremity  deformity noted. Range of motion normal .Tone & strength normal. Hand joints  reveal minor   DJD DIP changes. Fingernail health good. Able to lie down & sit up w/o help. Negative SLR bilaterally Vascular: Carotid, radial artery, dorsalis pedis and  posterior tibial pulses are full and equal. No bruits present. Neurologic: Alert and oriented x3. Deep tendon reflexes symmetrical and normal.        Skin: Intact without suspicious lesions or rashes. Lymph: No cervical, axillary lymphadenopathy present. Psych: Mood and affect are normal. Normally interactive                                                                                        Assessment & Plan:  #1 comprehensive physical exam; no acute findings #2 tenosynovitis L thumb  Plan: see Orders  & Recommendations

## 2013-03-02 ENCOUNTER — Other Ambulatory Visit (INDEPENDENT_AMBULATORY_CARE_PROVIDER_SITE_OTHER): Payer: PRIVATE HEALTH INSURANCE

## 2013-03-02 DIAGNOSIS — Z0000 Encounter for general adult medical examination without abnormal findings: Secondary | ICD-10-CM

## 2013-03-02 LAB — HEPATIC FUNCTION PANEL
AST: 19 U/L (ref 0–37)
Albumin: 3.6 g/dL (ref 3.5–5.2)
Alkaline Phosphatase: 56 U/L (ref 39–117)
Bilirubin, Direct: 0 mg/dL (ref 0.0–0.3)
Total Bilirubin: 0.8 mg/dL (ref 0.3–1.2)
Total Protein: 7.1 g/dL (ref 6.0–8.3)

## 2013-03-02 LAB — IBC PANEL
Iron: 93 ug/dL (ref 42–145)
Saturation Ratios: 25.4 % (ref 20.0–50.0)

## 2013-03-02 LAB — LIPID PANEL
HDL: 38.5 mg/dL — ABNORMAL LOW (ref >39.00)
LDL Cholesterol: 79 mg/dL (ref 0–99)
Total CHOL/HDL Ratio: 3
Triglycerides: 77 mg/dL (ref 0.0–149.0)
VLDL: 15.4 mg/dL (ref 0.0–40.0)

## 2013-03-02 LAB — TSH: TSH: 2.04 u[IU]/mL (ref 0.35–5.50)

## 2013-03-04 ENCOUNTER — Encounter: Payer: Self-pay | Admitting: Internal Medicine

## 2013-03-05 LAB — VITAMIN D 1,25 DIHYDROXY
Vitamin D2 1, 25 (OH)2: <8 pg/mL
Vitamin D3 1, 25 (OH)2: 46 pg/mL

## 2013-03-06 ENCOUNTER — Other Ambulatory Visit: Payer: Self-pay | Admitting: *Deleted

## 2013-03-06 NOTE — Telephone Encounter (Signed)
Updated medication list.//AB/CMA

## 2013-03-06 NOTE — Telephone Encounter (Signed)
Updated Immunization for flu shot.//AB/CMA

## 2013-06-11 ENCOUNTER — Emergency Department (INDEPENDENT_AMBULATORY_CARE_PROVIDER_SITE_OTHER)
Admission: EM | Admit: 2013-06-11 | Discharge: 2013-06-11 | Disposition: A | Discharge status: Home / Self Care | Payer: PRIVATE HEALTH INSURANCE | Source: Home / Self Care | Attending: Emergency Medicine | Admitting: Emergency Medicine

## 2013-06-11 ENCOUNTER — Encounter: Payer: Self-pay | Admitting: Emergency Medicine

## 2013-06-11 DIAGNOSIS — N390 Urinary tract infection, site not specified: Secondary | ICD-10-CM

## 2013-06-11 LAB — POCT URINALYSIS DIP (MANUAL ENTRY)
Bilirubin, UA: NEGATIVE
Glucose, UA: NEGATIVE
Nitrite, UA: POSITIVE
Protein Ur, POC: 100
Spec Grav, UA: >=1.03
Urobilinogen, UA: 0.2
pH, UA: 6.5

## 2013-06-11 MED ORDER — CIPROFLOXACIN HCL 500 MG PO TABS: 500.0000 mg | ORAL_TABLET | Freq: Two times a day (BID) | ORAL | Status: DC

## 2013-06-11 MED ORDER — CEFTRIAXONE SODIUM 1 G IJ SOLR
1.0000 g | INTRAMUSCULAR | Status: AC
  Administered 2013-06-11: 1 g via INTRAMUSCULAR

## 2013-06-11 NOTE — ED Provider Notes (Signed)
CSN: [id number]     Arrival date & time 06/11/13  1131 History   First MD Initiated Contact with Patient 06/11/13 1141     Chief Complaint  Patient presents with  . Fever  . Dysuria     The history is provided by the patient.  This is a 58 y.o. female who presents today with UTI symptoms for 2 days.  + dysuria + frequency + urgency Occasional episodes of urinary incontinence past day.--No prior history of incontinence. No hematuria No change of bowel habits. No blood in stool. No dark colored stools. No vaginal discharge + fever/chills Denies myalgias or rash. No lower abdominal pain Minimal nausea, this resolved today. Has decreased appetite but tolerating by mouth liquids. No vomiting No back pain + fatigue. No lightheadedness dizziness or syncope or orthostatic changes or focal neurologic symptoms. She denies chance of pregnancy. Has tried over-the-counter measures without improvement. Denies history of chronic UTIs per     Past Medical History  Diagnosis Date  . History of anemia   . GERD (gastroesophageal reflux disease)   . Osteopenia   . Hyperlipemia   . Vitamin D deficiency 2008  . 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 Transaminitis 07/13/2011    Borderline  SGPT 38  06/18/11 normal SGOT  . Factor 5 Leiden mutation, heterozygous     Dr Beryle Beams, Hematology  . Clotting disorder    Past Surgical History  Procedure Laterality Date  . Carpal tunnel release Right     RUE  . Colonoscopy  2007    Negative; Dr Carlean Purl  . Upper gastrointestinal endoscopy  2007    hiatal hernia   Family History  Problem Relation Age of Onset  . Hyperlipidemia Father   . Osteoporosis Mother   . Hyperlipidemia Mother   . Hypothyroidism Mother   . Diabetes Brother     died in sleep in context of pulmonary infection @ 85  . Hyperlipidemia Brother   . Clotting disorder Brother     factor 5 deficiency with DVT  . Diabetes Maternal Grandmother   .  CVA Mother 71   History  Substance Use Topics  . Smoking status: Never Smoker   . Smokeless tobacco: Not on file  . Alcohol Use: No   OB History   Grav Para Term Preterm Abortions TAB SAB Ect Mult Living   2 2 2       2      Review of Systems  All other systems reviewed and are negative.      Allergies  Review of patient's allergies indicates no known allergies.  Home Medications   Current Outpatient Rx  Name  Route  Sig  Dispense  Refill  . Omeprazole-Sodium Bicarbonate (ZEGERID) 20-1100 MG CAPS capsule   Oral   Take 1 capsule by mouth daily before breakfast.         . aspirin EC 81 MG tablet   Oral   Take 81 mg by mouth daily.         . Biotin 5000 MCG CAPS   Oral   Take 1 capsule by mouth.         . calcium carbonate (OS-CAL) 600 MG TABS   Oral   Take 600 mg by mouth 2 (two) times daily with a meal.          . Cholecalciferol (VITAMIN D) 2000 UNITS CAPS      Take on Mon,Wed,Fri   30 capsule      .  ciprofloxacin (CIPRO) 500 MG tablet   Oral   Take 1 tablet (500 mg total) by mouth 2 (two) times daily. For 5 days.--Then,Refill rx for another 5 days unless otherwise advised by M.D.   10 tablet   1   . Omega-3 Fatty Acids (FISH OIL) 1000 MG CAPS   Oral   Take by mouth 2 (two) times daily.          . pravastatin (PRAVACHOL) 20 MG tablet      TAKE 1 TABLET BY MOUTH EVERY DAY   90 tablet   3    BP 105/70  Pulse 109  Temp(Src) 102.9 F (39.4 C) (Oral)  Ht 5\' 3"  (1.6 m)  Wt 124 lb 8 oz (56.473 kg)  BMI 22.06 kg/m2  SpO2 98%  LMP 02/16/1999 Physical Exam  Nursing note and vitals reviewed. Constitutional: She is oriented to person, place, and time. She appears well-developed and well-nourished. No distress.  HENT:  Mouth/Throat: Oropharynx is clear and moist.  Eyes: No scleral icterus.  Neck: Neck supple.  Cardiovascular: Normal rate, regular rhythm and normal heart sounds.   Pulmonary/Chest: Breath sounds normal.  Abdominal: Soft.  She exhibits no mass. There is no hepatosplenomegaly. There is tenderness in the suprapubic area. There is no rebound, no guarding and no CVA tenderness.  Lymphadenopathy:    She has no cervical adenopathy.  Neurological: She is alert and oriented to person, place, and time.  Skin: Skin is warm and dry. No rash noted.    ED Course  Procedures (including critical care time) Labs Review Labs Reviewed  URINE CULTURE  POCT URINALYSIS DIP (MANUAL ENTRY)   Imaging Review No results found.    MDM   Final diagnoses:  UTI (urinary tract infection)    Urinalysis positive for blood, nitrates and leukocytes. Urine is cloudy. No CVA tenderness, however I suspect ascending nephritis. She appears acutely ill, but nontoxic, and in my opinion she would benefit from aggressive initial treatment with IM antibiotic.   Treatment options discussed, as well as risks, benefits, alternatives. Patient voiced understanding and agreement with the following plans:  Rocephin 1 g IM stat Cipro 500 mg by mouth twice a day x10 days Urine culture Push fluids and other symptomatic care. She declined any antinausea medication as she denies nausea currently. Follow-up with your primary care doctor in 2-3 days if not improving, or sooner if symptoms become worse. Precautions discussed. Red flags discussed. Questions invited and answered. Patient voiced understanding and agreement.     Jacqulyn Cane, MD 06/11/13 1520

## 2013-06-11 NOTE — ED Notes (Signed)
Pt has had incontinence and painful urination on and off for 1 week.  Fever started 2 days ago it is 102.9 today 3 hours after tylenol. Experiencing chills and body aches also.

## 2013-06-14 LAB — URINE CULTURE: Colony Count: >=100000

## 2014-02-19 ENCOUNTER — Encounter: Payer: Self-pay | Admitting: Emergency Medicine

## 2014-02-20 ENCOUNTER — Other Ambulatory Visit: Payer: Self-pay | Admitting: Gynecology

## 2014-02-20 ENCOUNTER — Other Ambulatory Visit: Payer: Self-pay | Admitting: *Deleted

## 2014-02-20 DIAGNOSIS — E782 Mixed hyperlipidemia: Secondary | ICD-10-CM

## 2014-02-20 DIAGNOSIS — Z1231 Encounter for screening mammogram for malignant neoplasm of breast: Secondary | ICD-10-CM

## 2014-02-20 MED ORDER — PRAVASTATIN SODIUM 20 MG PO TABS: ORAL_TABLET | ORAL | Status: DC

## 2014-02-20 NOTE — Telephone Encounter (Signed)
Left msg on triage have appt to see md 03/29/14 needing pravastatin. Called pt no answer LMOM will send in 30 day until appt...[name]

## 2014-02-22 ENCOUNTER — Ambulatory Visit (HOSPITAL_COMMUNITY)
Admission: RE | Admit: 2014-02-22 | Discharge: 2014-02-22 | Disposition: A | Discharge status: Home / Self Care | Payer: PRIVATE HEALTH INSURANCE | Source: Ambulatory Visit | Attending: Gynecology | Admitting: Gynecology

## 2014-02-22 DIAGNOSIS — Z1231 Encounter for screening mammogram for malignant neoplasm of breast: Secondary | ICD-10-CM | POA: Diagnosis present

## 2014-03-29 ENCOUNTER — Encounter: Payer: PRIVATE HEALTH INSURANCE | Admitting: Internal Medicine

## 2014-03-29 ENCOUNTER — Ambulatory Visit (INDEPENDENT_AMBULATORY_CARE_PROVIDER_SITE_OTHER): Payer: PRIVATE HEALTH INSURANCE | Admitting: Internal Medicine

## 2014-03-29 ENCOUNTER — Other Ambulatory Visit (INDEPENDENT_AMBULATORY_CARE_PROVIDER_SITE_OTHER): Payer: PRIVATE HEALTH INSURANCE

## 2014-03-29 ENCOUNTER — Encounter: Payer: Self-pay | Admitting: Internal Medicine

## 2014-03-29 VITALS — BP 122/82 | HR 67 | Temp 98.1°F | Resp 12 | Ht 63.0 in | Wt 130.1 lb

## 2014-03-29 DIAGNOSIS — Z0189 Encounter for other specified special examinations: Secondary | ICD-10-CM

## 2014-03-29 DIAGNOSIS — K219 Gastro-esophageal reflux disease without esophagitis: Secondary | ICD-10-CM | POA: Diagnosis not present

## 2014-03-29 DIAGNOSIS — M8580 Other specified disorders of bone density and structure, unspecified site: Secondary | ICD-10-CM

## 2014-03-29 DIAGNOSIS — E782 Mixed hyperlipidemia: Secondary | ICD-10-CM | POA: Diagnosis not present

## 2014-03-29 DIAGNOSIS — Z0000 Encounter for general adult medical examination without abnormal findings: Secondary | ICD-10-CM

## 2014-03-29 LAB — CBC WITH DIFFERENTIAL/PLATELET
Basophils Absolute: 0 10*3/uL (ref 0.0–0.1)
Basophils Relative: 0.7 % (ref 0.0–3.0)
Eosinophils Absolute: 0.2 10*3/uL (ref 0.0–0.7)
Eosinophils Relative: 2.3 % (ref 0.0–5.0)
HCT: 40.4 % (ref 36.0–46.0)
Hemoglobin: 13.6 g/dL (ref 12.0–15.0)
Lymphocytes Relative: 25.7 % (ref 12.0–46.0)
Lymphs Abs: 1.8 10*3/uL (ref 0.7–4.0)
MCHC: 33.7 g/dL (ref 30.0–36.0)
MCV: 88 fl (ref 78.0–100.0)
MONOS PCT: 8.4 % (ref 3.0–12.0)
Monocytes Absolute: 0.6 10*3/uL (ref 0.1–1.0)
NEUTROS PCT: 62.9 % (ref 43.0–77.0)
Neutro Abs: 4.3 10*3/uL (ref 1.4–7.7)
Platelets: 246 10*3/uL (ref 150.0–400.0)
RBC: 4.59 Mil/uL (ref 3.87–5.11)
RDW: 13.8 % (ref 11.5–15.5)
WBC: 6.9 10*3/uL (ref 4.0–10.5)

## 2014-03-29 LAB — BASIC METABOLIC PANEL
BUN: 19 mg/dL (ref 6–23)
CO2: 29 mEq/L (ref 19–32)
CREATININE: 0.7 mg/dL (ref 0.4–1.2)
Calcium: 9.2 mg/dL (ref 8.4–10.5)
Chloride: 102 mEq/L (ref 96–112)
GFR: 92.45 mL/min (ref >60.00)
Glucose, Bld: 96 mg/dL (ref 70–99)
Potassium: 4.3 mEq/L (ref 3.5–5.1)
Sodium: 137 mEq/L (ref 135–145)

## 2014-03-29 LAB — LIPID PANEL
CHOLESTEROL: 147 mg/dL (ref 0–200)
HDL: 46.1 mg/dL (ref >39.00)
LDL Cholesterol: 82 mg/dL (ref 0–99)
NonHDL: 100.9
TRIGLYCERIDES: 93 mg/dL (ref 0.0–149.0)
Total CHOL/HDL Ratio: 3
VLDL: 18.6 mg/dL (ref 0.0–40.0)

## 2014-03-29 LAB — HEPATIC FUNCTION PANEL
ALT: 21 U/L (ref 0–35)
AST: 23 U/L (ref 0–37)
Albumin: 4.1 g/dL (ref 3.5–5.2)
Alkaline Phosphatase: 65 U/L (ref 39–117)
BILIRUBIN DIRECT: 0 mg/dL (ref 0.0–0.3)
TOTAL PROTEIN: 7.4 g/dL (ref 6.0–8.3)
Total Bilirubin: 0.7 mg/dL (ref 0.2–1.2)

## 2014-03-29 LAB — TSH: TSH: 3.65 u[IU]/mL (ref 0.35–4.50)

## 2014-03-29 LAB — VITAMIN D 25 HYDROXY (VIT D DEFICIENCY, FRACTURES): VITD: 44.04 ng/mL (ref 30.00–100.00)

## 2014-03-29 MED ORDER — PRAVASTATIN SODIUM 20 MG PO TABS: ORAL_TABLET | ORAL | Status: DC

## 2014-03-29 NOTE — Progress Notes (Signed)
Subjective:    Patient ID: Shannon West, female    DOB: February 26, 1956, 58 y.o.   MRN: [id number]  HPI  She is here for a physical;acute issues denied.  She has a heart healthy diet. She exercises a high cardiovascular level for 90 minutes 3 times a week without cardiopulmonary symptoms  There is no family history of premature heart attack or stroke.  She's been compliant with her statin without adverse effect. Advanced cholesterol testing reveals her LDL goal is less than 100, ideally less than 70  She is on protein pump inhibitor because of history of GERD and hiatal hernia. She has no active GI symptoms at this time  Colonoscopy is up-to-date.  She is overdue for bone density. She has had significant osteopenia with a T score of -2.3 at the hip. She did take a bisphosphonate for 5 years.     Review of Systems    Chest pain, palpitations, tachycardia, exertional dyspnea, paroxysmal nocturnal dyspnea, claudication or edema are absent.  Unexplained weight loss, abdominal pain, significant dyspepsia, dysphagia, melena, rectal bleeding, or persistently small caliber stools are denied.     Objective:   Physical Exam Gen.: Thin but healthy and well-nourished in appearance. Alert, appropriate and cooperative throughout exam. Appears younger than stated age  Head: Normocephalic without obvious abnormalities  Eyes: No corneal or conjunctival inflammation noted. Pupils equal round reactive to light and accommodation. Extraocular motion intact.  Ears: External  ear exam reveals no significant lesions or deformities. Canals clear .TMs normal. Hearing is grossly normal bilaterally. Nose: External nasal exam reveals no deformity or inflammation. Nasal mucosa are pink and moist. No lesions or exudates noted.   Mouth: Oral mucosa and oropharynx reveal no lesions or exudates. Teeth in good repair. Neck: No deformities, masses, or tenderness noted. Range of motion & Thyroid normal. Lungs: Normal  respiratory effort; chest expands symmetrically. Lungs are clear to auscultation without rales, wheezes, or increased work of breathing. Heart: Normal rate and rhythm. Normal S1 and S2. No gallop, click, or rub. No murmur. Abdomen: Bowel sounds normal; abdomen soft and nontender. No masses, organomegaly or hernias noted.Aorta palpable ; no AAA Genitalia: as per Gyn                                  Musculoskeletal/extremities: No deformity or scoliosis noted of  the thoracic or lumbar spine.  No clubbing, cyanosis, edema, or significant extremity  deformity noted.  Range of motion normal . Tone & strength normal. Hand joints normal  Fingernail health good. Able to lie down & sit up w/o help.  Negative SLR bilaterally Vascular: Carotid, radial artery, dorsalis pedis and  posterior tibial pulses are full and equal. No bruits present. Neurologic: Alert and oriented x3. Deep tendon reflexes symmetrical and normal.  Gait normal .    Skin: Intact without suspicious lesions or rashes. Lymph: No cervical, axillary lymphadenopathy present. Psych: Mood and affect are normal. Normally interactive  Assessment & Plan:  #1 comprehensive physical exam; no acute findings  Plan: see Orders  & Recommendations

## 2014-03-29 NOTE — Patient Instructions (Signed)
Your next office appointment will be determined based upon review of your pending labs. Those instructions will be transmitted to you through My Chart . 

## 2014-03-29 NOTE — Progress Notes (Signed)
Pre visit review using our clinic review tool, if applicable. No additional management support is needed unless otherwise documented below in the visit note. 

## 2014-04-26 ENCOUNTER — Telehealth: Payer: Self-pay

## 2014-04-26 ENCOUNTER — Ambulatory Visit (HOSPITAL_COMMUNITY)
Admission: RE | Admit: 2014-04-26 | Discharge: 2014-04-26 | Disposition: A | Discharge status: Home / Self Care | Payer: No Typology Code available for payment source | Source: Ambulatory Visit | Attending: Internal Medicine | Admitting: Internal Medicine

## 2014-04-26 DIAGNOSIS — M8580 Other specified disorders of bone density and structure, unspecified site: Secondary | ICD-10-CM | POA: Insufficient documentation

## 2014-04-26 DIAGNOSIS — Z13820 Encounter for screening for osteoporosis: Secondary | ICD-10-CM | POA: Diagnosis present

## 2014-04-26 NOTE — Telephone Encounter (Signed)
-----   Message from Hendricks Limes, MD sent at 04/26/2014 12:14 PM EST ----- Normal T scores on a bone density exam (BMD)  are +1 to -1. Osteopenia would be -1.1 to -2.4. Osteoporosis is defined by a  T score worse than -2.4. Your value of -2.4 documents  Osteopenia @ the hips; but this is stable and fracture risk is not increased. BMD should be monitored every 25 months. Recommended lifestyle interventions to prevent  Osteoporosis include calcium 600 mg 1-2 per day  & vitamin D3 supplementation to keep vitamin  D  level @ least 40-60. The usual vitamin D3 dose is 1000 IU daily; but individual dose is determined by annual vitamin D level monitor. Also weight bearing exercise such as  walking 30-45 minutes 3-4  X per week is recommended.  [employer]

## 2014-04-26 NOTE — Telephone Encounter (Signed)
Information has been mailed to patient. 

## 2014-06-04 ENCOUNTER — Encounter: Payer: Self-pay | Admitting: Internal Medicine

## 2014-09-28 ENCOUNTER — Telehealth: Payer: Self-pay | Admitting: *Deleted

## 2014-09-28 NOTE — Telephone Encounter (Signed)
Pt called and is planning on flying in August and wanted to make you aware of this. She has a Factor 5 Lieden mutation and takes Asa 81 mg daily. Should she increase the dose?  Pt # [id number]

## 2014-10-02 NOTE — Telephone Encounter (Signed)
      Please inform patient she does not need to increase aspirin dose when she travels but I would recommend that she wear elastic stockings if plane flight is long.

## 2014-10-02 NOTE — Telephone Encounter (Signed)
Pt given information from Dr Beryle Beams

## 2015-02-11 ENCOUNTER — Telehealth: Payer: Self-pay | Admitting: Internal Medicine

## 2015-02-11 DIAGNOSIS — E782 Mixed hyperlipidemia: Secondary | ICD-10-CM

## 2015-02-11 NOTE — Telephone Encounter (Signed)
Patient has her CPX on 05/15/2014 with dr burns. She is going to be out of pravastatin (PRAVACHOL) 20 MG tablet [[id number]] by the end of this year. She is requesting that we send a 30 day for the month of jan to [facility] in [place]. Please send mychart message to advise.

## 2015-02-13 MED ORDER — PRAVASTATIN SODIUM 20 MG PO TABS: ORAL_TABLET | ORAL | Status: DC

## 2015-02-13 NOTE — Telephone Encounter (Signed)
Okay to send in now?

## 2015-02-13 NOTE — Telephone Encounter (Signed)
OK  She's a very nice lady; you'll enjoy working with her. [employer]

## 2015-02-27 ENCOUNTER — Ambulatory Visit (INDEPENDENT_AMBULATORY_CARE_PROVIDER_SITE_OTHER): Payer: PRIVATE HEALTH INSURANCE | Admitting: Obstetrics and Gynecology

## 2015-02-27 ENCOUNTER — Encounter: Payer: Self-pay | Admitting: Obstetrics and Gynecology

## 2015-02-27 VITALS — BP 108/58 | HR 68 | Resp 14 | Ht 63.0 in | Wt 123.0 lb

## 2015-02-27 DIAGNOSIS — Z01419 Encounter for gynecological examination (general) (routine) without abnormal findings: Secondary | ICD-10-CM | POA: Diagnosis not present

## 2015-02-27 NOTE — Patient Instructions (Signed)

## 2015-02-27 NOTE — Progress Notes (Signed)
Patient ID: Shannon West, female   DOB: 22-Jan-1956, 59 y.o.   MRN: [id number] 59 y.o. [id number] MarriedCaucasianF here for annual exam.  No vaginal bleeding. Sexually active, no pain with lubrication.  She had a DEXA this year with a T score of -2.4. We discussed going off of the omeprazole.      Patient's last menstrual period was 02/16/1999.          Sexually active: Yes.    The current method of family planning is post menopausal status.    Exercising: Yes.    running, 100 sit ups, weights Smoker:  no  Health Maintenance: Pap:  02-15-13 WNL NEG HR HPV History of abnormal Pap:  no MMG:  02-23-14 WNL Colonoscopy:  2007 BMD:   04-26-14 osteopenia  TDaP:  2007 Gardasil: N/A   reports that she has never smoked. She has never used smokeless tobacco. She reports that she does not drink alcohol or use illicit drugs.  Past Medical History  Diagnosis Date  . History of anemia   . GERD (gastroesophageal reflux disease)   . Osteopenia     BMD @ [employer] [facility]  . Hyperlipemia   . Vitamin D deficiency 2008  . 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 Transaminitis 07/13/2011    Borderline  SGPT 38  06/18/11 normal SGOT  . Factor 5 Leiden mutation, heterozygous [facility])     Dr Beryle Beams, Hematology  . Clotting disorder [facility])     Past Surgical History  Procedure Laterality Date  . Carpal tunnel release Right     RUE  . Colonoscopy  2007    Negative; Dr Carlean Purl  . Upper gastrointestinal endoscopy  2007    hiatal hernia    Current Outpatient Prescriptions  Medication Sig Dispense Refill  . aspirin EC 81 MG tablet Take 81 mg by mouth daily.    . calcium carbonate (OS-CAL) 600 MG TABS Take 600 mg by mouth 2 (two) times daily with a meal.     . Cholecalciferol (VITAMIN D) 2000 UNITS CAPS Take on Mon,Wed,Fri 30 capsule   . Omega-3 Fatty Acids (FISH OIL) 1000 MG CAPS Take by mouth 2 (two) times daily.     [place] Bicarbonate (ZEGERID) 20-1100 MG CAPS capsule  Take 1 capsule by mouth daily before breakfast.    . pravastatin (PRAVACHOL) 20 MG tablet TAKE 1 TABLET BY MOUTH EVERY DAY 90 tablet 0   No current facility-administered medications for this visit.    Family History  Problem Relation Age of Onset  . Hyperlipidemia Father   . Osteoporosis Mother   . Hyperlipidemia Mother   . Hypothyroidism Mother   . Diabetes Brother     died in sleep in context of pulmonary infection @ 32  . Hyperlipidemia Brother   . Clotting disorder Brother     factor 5 deficiency with DVT  . Diabetes Maternal Grandmother   . CVA Mother 77  . Heart attack Neg Hx   . Aortic aneurysm Father     smoker    Review of Systems  Constitutional: Negative.   HENT: Negative.   Eyes: Negative.   Respiratory: Negative.   Cardiovascular: Negative.   Gastrointestinal: Negative.   Endocrine: Negative.   Genitourinary: Negative.   Musculoskeletal: Negative.   Skin: Negative.   Allergic/Immunologic: Negative.   Neurological: Negative.   Psychiatric/Behavioral: Negative.     Exam:   BP 108/58 mmHg  Pulse 68  Resp 14  Ht  5\' 3"  (1.6 m)  Wt 123 lb (55.792 kg)  BMI 21.79 kg/m2  LMP 02/16/1999  Weight change: @WEIGHTCHANGE @ Height:   Height: 5\' 3"  (160 cm)  Ht Readings from Last 3 Encounters:  02/27/15 5\' 3"  (1.6 m)  03/29/14 5\' 3"  (1.6 m)  06/11/13 5\' 3"  (1.6 m)    General appearance: alert, cooperative and appears stated age Head: Normocephalic, without obvious abnormality, atraumatic Neck: no adenopathy, supple, symmetrical, trachea midline and thyroid normal to inspection and palpation Lungs: clear to auscultation bilaterally Breasts: normal appearance, no masses or tenderness Heart: regular rate and rhythm Abdomen: soft, non-tender; bowel sounds normal; no masses,  no organomegaly Extremities: extremities normal, atraumatic, no cyanosis or edema Skin: Skin color, texture, turgor normal. No rashes or lesions Lymph nodes: Cervical, supraclavicular, and  axillary nodes normal. No abnormal inguinal nodes palpated Neurologic: Grossly normal   Pelvic: External genitalia:  no lesions              Urethra:  normal appearing urethra with no masses, tenderness or lesions              Bartholins and Skenes: normal                 Vagina: normal appearing vagina with normal color and discharge, no lesions. Atrophic              Cervix: no lesions               Bimanual Exam:  Uterus:  normal size, contour, position, consistency, mobility, non-tender              Adnexa: no mass, fullness, tenderness               Rectovaginal: Confirms               Anus:  normal sphincter tone, no lesions  Chaperone was present for exam.  A:  Well Woman with normal exam   Osteopenia  P:    No pap this year  Mammogram  Colonoscopy next year  Continue calcium, vit D and exercise  Try to get off her proton pump inhibitor  DEXA in 2018

## 2015-02-28 ENCOUNTER — Encounter: Payer: No Typology Code available for payment source | Admitting: Internal Medicine

## 2015-03-05 ENCOUNTER — Encounter: Payer: Self-pay | Admitting: Internal Medicine

## 2015-03-05 ENCOUNTER — Other Ambulatory Visit (INDEPENDENT_AMBULATORY_CARE_PROVIDER_SITE_OTHER): Payer: No Typology Code available for payment source

## 2015-03-05 ENCOUNTER — Ambulatory Visit (INDEPENDENT_AMBULATORY_CARE_PROVIDER_SITE_OTHER): Payer: No Typology Code available for payment source | Admitting: Internal Medicine

## 2015-03-05 VITALS — BP 138/80 | HR 57 | Temp 97.8°F | Wt 125.0 lb

## 2015-03-05 DIAGNOSIS — Z0000 Encounter for general adult medical examination without abnormal findings: Secondary | ICD-10-CM

## 2015-03-05 DIAGNOSIS — Z0189 Encounter for other specified special examinations: Secondary | ICD-10-CM | POA: Diagnosis not present

## 2015-03-05 LAB — HEPATIC FUNCTION PANEL
ALT: 17 U/L (ref 0–35)
AST: 21 U/L (ref 0–37)
Albumin: 4.2 g/dL (ref 3.5–5.2)
Alkaline Phosphatase: 64 U/L (ref 39–117)
BILIRUBIN TOTAL: 0.4 mg/dL (ref 0.2–1.2)
Bilirubin, Direct: 0.1 mg/dL (ref 0.0–0.3)
TOTAL PROTEIN: 7.4 g/dL (ref 6.0–8.3)

## 2015-03-05 LAB — LIPID PANEL
CHOLESTEROL: 145 mg/dL (ref 0–200)
HDL: 44.7 mg/dL (ref >39.00)
LDL Cholesterol: 83 mg/dL (ref 0–99)
NonHDL: 100.74
TRIGLYCERIDES: 90 mg/dL (ref 0.0–149.0)
Total CHOL/HDL Ratio: 3
VLDL: 18 mg/dL (ref 0.0–40.0)

## 2015-03-05 LAB — BASIC METABOLIC PANEL
BUN: 15 mg/dL (ref 6–23)
CO2: 30 mEq/L (ref 19–32)
Calcium: 9.6 mg/dL (ref 8.4–10.5)
Chloride: 103 mEq/L (ref 96–112)
Creatinine, Ser: 0.77 mg/dL (ref 0.40–1.20)
GFR: 81.2 mL/min (ref >60.00)
GLUCOSE: 92 mg/dL (ref 70–99)
POTASSIUM: 4.4 meq/L (ref 3.5–5.1)
Sodium: 139 mEq/L (ref 135–145)

## 2015-03-05 LAB — CBC WITH DIFFERENTIAL/PLATELET
BASOS PCT: 0.6 % (ref 0.0–3.0)
Basophils Absolute: 0 10*3/uL (ref 0.0–0.1)
Eosinophils Absolute: 0.2 10*3/uL (ref 0.0–0.7)
Eosinophils Relative: 2.4 % (ref 0.0–5.0)
HEMATOCRIT: 39.9 % (ref 36.0–46.0)
HEMOGLOBIN: 13.6 g/dL (ref 12.0–15.0)
Lymphocytes Relative: 25.6 % (ref 12.0–46.0)
Lymphs Abs: 1.8 10*3/uL (ref 0.7–4.0)
MCHC: 34.2 g/dL (ref 30.0–36.0)
MCV: 88.3 fl (ref 78.0–100.0)
MONO ABS: 0.7 10*3/uL (ref 0.1–1.0)
Monocytes Relative: 9.2 % (ref 3.0–12.0)
Neutro Abs: 4.5 10*3/uL (ref 1.4–7.7)
Neutrophils Relative %: 62.2 % (ref 43.0–77.0)
Platelets: 261 10*3/uL (ref 150.0–400.0)
RBC: 4.52 Mil/uL (ref 3.87–5.11)
RDW: 15.4 % (ref 11.5–15.5)
WBC: 7.2 10*3/uL (ref 4.0–10.5)

## 2015-03-05 LAB — TSH: TSH: 2.96 u[IU]/mL (ref 0.35–4.50)

## 2015-03-05 LAB — VITAMIN D 25 HYDROXY (VIT D DEFICIENCY, FRACTURES): VITD: 36.46 ng/mL (ref 30.00–100.00)

## 2015-03-05 NOTE — Progress Notes (Signed)
Pre visit review using our clinic review tool, if applicable. No additional management support is needed unless otherwise documented below in the visit note. 

## 2015-03-05 NOTE — Progress Notes (Signed)
   Subjective:    Patient ID: Shannon West, female    DOB: 09/20/1955, 59 y.o.   MRN: [id number]  HPI The patient is here for a physical to assess status of active health conditions.  She has been compliant with her medications without adverse effects. She is questioning weaning the omeprazole because of risk of osteopenia exacerbation. She does not have significant reflux symptoms. She does not smoke or drink. She's been taking her prophylactic aspirin at bedtime.  She's on a heart healthy diet. She runs a mile a day as well as performing 100 sit ups and using 10 pound weights at least 5 days a week. She has no associated cardio pulmonary symptoms.  Her last colonoscopy was 2007; repeat is due next year.  PMH, FH, & Social History reviewed & updated.No change in [place] as recorded.    Review of Systems  Chest pain, palpitations, tachycardia, exertional dyspnea, paroxysmal nocturnal dyspnea, claudication or edema are absent. No unexplained weight loss, abdominal pain, significant dyspepsia, dysphagia, melena, rectal bleeding, or persistently small caliber stools. Dysuria, pyuria, hematuria, frequency,  or polyuria are denied. Nocturia X 1 early am. Change in hair, skin, nails denied. No bowel changes of constipation or diarrhea. No intolerance to heat ; some cold intolerance.     Objective:   Physical Exam  Pertinent or positive findings include: She has minor DIP arthritic changes. She has crepitus in the knees, greater on the left.  General appearance :adequately nourished; in no distress.  Eyes: No conjunctival inflammation or scleral icterus is present.  Oral exam:  Lips and gums are healthy appearing.There is no oropharyngeal erythema or exudate noted. Dental hygiene is good.  Heart:  Normal rate and regular rhythm. S1 and S2 normal without gallop, murmur, click, rub or other extra sounds    Lungs:Chest clear to auscultation; no wheezes, rhonchi,rales ,or rubs present.No  increased work of breathing.   Abdomen: bowel sounds normal, soft and non-tender without masses, organomegaly or hernias noted.  No guarding or rebound.   Vascular : all pulses equal ; no bruits present.  Skin:Warm & dry.  Intact without suspicious lesions or rashes ; no tenting or jaundice   Lymphatic: No lymphadenopathy is noted about the head, neck, axilla   Neuro: Strength, tone & DTRs normal.     Assessment & Plan:  #1 comprehensive physical exam; no acute findings  Plan: see Orders  & Recommendations

## 2015-03-05 NOTE — Patient Instructions (Signed)
Please take enteric-coated aspirin 81 mg daily with breakfast.Reflux of gastric acid may be asymptomatic as this may occur mainly during sleep.The triggers for reflux  include stress; the "aspirin family" ; alcohol; peppermint; and caffeine (coffee, tea, cola, and chocolate). The aspirin family would include aspirin and the nonsteroidal agents such as ibuprofen &  Naproxen. Tylenol would not cause reflux. If having symptoms ; food & drink should be avoided for @ least 2 hours before going to bed.

## 2015-03-20 ENCOUNTER — Telehealth: Payer: Self-pay | Admitting: Obstetrics and Gynecology

## 2015-03-20 NOTE — Telephone Encounter (Signed)
Call to patient. Advised pap smear not taken at annual exam. Advised 2014 pap smear was normal and negative High risk hpv, Pap smear not indicated this year. Patient agreeable and will discuss next year at annual exam.  Routing to provider for final review. Patient agreeable to disposition. Will close encounter.

## 2015-03-20 NOTE — Telephone Encounter (Signed)
Patient wants to talk with the nurse. She states she never received her results from her last pap. She would like to have this information for her records.

## 2015-03-29 ENCOUNTER — Other Ambulatory Visit: Payer: Self-pay | Admitting: Emergency Medicine

## 2015-03-29 ENCOUNTER — Encounter: Payer: Self-pay | Admitting: Internal Medicine

## 2015-03-29 DIAGNOSIS — E782 Mixed hyperlipidemia: Secondary | ICD-10-CM

## 2015-03-29 MED ORDER — PRAVASTATIN SODIUM 20 MG PO TABS: ORAL_TABLET | ORAL | Status: DC

## 2015-05-16 ENCOUNTER — Encounter: Payer: No Typology Code available for payment source | Admitting: Internal Medicine

## 2015-08-22 ENCOUNTER — Other Ambulatory Visit: Payer: Self-pay

## 2015-08-22 DIAGNOSIS — Z1231 Encounter for screening mammogram for malignant neoplasm of breast: Secondary | ICD-10-CM

## 2015-09-12 ENCOUNTER — Ambulatory Visit
Admission: RE | Admit: 2015-09-12 | Discharge: 2015-09-12 | Disposition: A | Discharge status: Home / Self Care | Payer: No Typology Code available for payment source | Source: Ambulatory Visit

## 2015-09-12 DIAGNOSIS — Z1231 Encounter for screening mammogram for malignant neoplasm of breast: Secondary | ICD-10-CM

## 2016-01-29 ENCOUNTER — Encounter: Payer: Self-pay | Admitting: Internal Medicine

## 2016-02-13 ENCOUNTER — Telehealth: Payer: Self-pay | Admitting: Internal Medicine

## 2016-02-13 NOTE — Telephone Encounter (Signed)
Okay with me 

## 2016-02-13 NOTE — Telephone Encounter (Signed)
Dr. Jacobs will you accept?  

## 2016-02-14 NOTE — Telephone Encounter (Signed)
Yes, happy to.  

## 2016-02-17 ENCOUNTER — Encounter: Payer: Self-pay | Admitting: Gastroenterology

## 2016-02-17 NOTE — Telephone Encounter (Signed)
Left message for pt to cb and sch with Dr.Jacobs

## 2016-03-05 ENCOUNTER — Encounter: Payer: Self-pay | Admitting: Obstetrics and Gynecology

## 2016-03-05 ENCOUNTER — Ambulatory Visit (INDEPENDENT_AMBULATORY_CARE_PROVIDER_SITE_OTHER): Payer: PRIVATE HEALTH INSURANCE | Admitting: Obstetrics and Gynecology

## 2016-03-05 VITALS — BP 102/60 | HR 72 | Resp 16 | Ht 63.0 in | Wt 128.0 lb

## 2016-03-05 DIAGNOSIS — E2839 Other primary ovarian failure: Secondary | ICD-10-CM | POA: Diagnosis not present

## 2016-03-05 DIAGNOSIS — Z124 Encounter for screening for malignant neoplasm of cervix: Secondary | ICD-10-CM | POA: Diagnosis not present

## 2016-03-05 DIAGNOSIS — Z01419 Encounter for gynecological examination (general) (routine) without abnormal findings: Secondary | ICD-10-CM

## 2016-03-05 DIAGNOSIS — M8580 Other specified disorders of bone density and structure, unspecified site: Secondary | ICD-10-CM | POA: Diagnosis not present

## 2016-03-05 NOTE — Progress Notes (Signed)
60 y.o. [id number] MarriedCaucasianF here for annual exam.   She has osteopenia, due for a DEXA in 1/18. She was on fosamax in the past for 5 years, off of it for 5-6 years. Sexually active, no pain, uses a lubricant.  Up 1-2 x a night to void at night, normal amounts.  No vaginal bleeding.     Patient's last menstrual period was 02/16/1999.          Sexually active: Yes.    The current method of family planning is post menopausal status.    Exercising: Yes.    treadmill Smoker:  no  Health Maintenance: Pap:  02-15-13 WNL NEG HR HPV History of abnormal Pap:  no MMG:  09-13-15 WNL Colonoscopy:  2007, has an appointment in January.  BMD:   05-06-14 Osteopenia  TDaP:  2006  Gardasil: N/A   reports that she has never smoked. She has never used smokeless tobacco. She reports that she does not drink alcohol or use drugs.Retired, she and her husband have an RV and travel. She takes care of 3 grand kids a few days a week, another grand baby on the way.   Past Medical History:  Diagnosis Date  . Clotting disorder ([place])   . Factor 5 Leiden mutation, heterozygous [facility])    Dr Beryle Beams, Hematology  . GERD (gastroesophageal reflux disease)   . History of anemia   . Hyperlipemia   . Hypochromic-microcytic anemia 07/13/2011   Hb 10.9 MCV 83.5   . Osteopenia    BMD @ [employer] [facility]  . Transaminitis 07/13/2011   Borderline  SGPT 38  06/18/11 normal SGOT  . Vitamin D deficiency 2008    Past Surgical History:  Procedure Laterality Date  . CARPAL TUNNEL RELEASE Right    RUE  . COLONOSCOPY  2007   Negative; Dr Carlean Purl  . UPPER GASTROINTESTINAL ENDOSCOPY  2007   hiatal hernia    Current Outpatient Prescriptions  Medication Sig Dispense Refill  . aspirin EC 81 MG tablet Take 81 mg by mouth daily.    . calcium carbonate (OS-CAL) 600 MG TABS Take 600 mg by mouth 2 (two) times daily with a meal.     . Cholecalciferol (VITAMIN D) 2000 UNITS CAPS Take on Mon,Wed,Fri 30 capsule   . Omega-3 Fatty  Acids (FISH OIL) 1000 MG CAPS Take by mouth 2 (two) times daily.     . pravastatin (PRAVACHOL) 20 MG tablet TAKE 1 TABLET BY MOUTH EVERY DAY 90 tablet 3   No current facility-administered medications for this visit.     Family History  Problem Relation Age of Onset  . Hyperlipidemia Father   . Osteoporosis Mother   . Hyperlipidemia Mother   . Hypothyroidism Mother   . Diabetes Brother     died in sleep in context of pulmonary infection @ 24  . Hyperlipidemia Brother   . Clotting disorder Brother     factor 5 deficiency with DVT  . Diabetes Maternal Grandmother   . CVA Mother 61  . Heart attack Neg Hx   . Aortic aneurysm Father     smoker    Review of Systems  Constitutional: Negative.   HENT: Negative.   Eyes: Negative.   Respiratory: Negative.   Cardiovascular: Negative.   Gastrointestinal: Negative.   Endocrine: Negative.   Genitourinary: Negative.   Musculoskeletal: Negative.   Skin: Negative.   Allergic/Immunologic: Negative.   Neurological: Negative.   Psychiatric/Behavioral: Negative.     Exam:   BP 102/60 (BP  Location: Right Arm, Patient Position: Sitting, Cuff Size: Normal)   Pulse 72   Resp 16   Ht 5\' 3"  (1.6 m)   Wt 128 lb (58.1 kg)   LMP 02/16/1999   BMI 22.67 kg/m   Weight change: @WEIGHTCHANGE @ Height:   Height: 5\' 3"  (160 cm)  Ht Readings from Last 3 Encounters:  03/05/16 5\' 3"  (1.6 m)  02/27/15 5\' 3"  (1.6 m)  03/29/14 5\' 3"  (1.6 m)    General appearance: alert, cooperative and appears stated age Head: Normocephalic, without obvious abnormality, atraumatic Neck: no adenopathy, supple, symmetrical, trachea midline and thyroid normal to inspection and palpation Lungs: clear to auscultation bilaterally Breasts: normal appearance, no masses or tenderness Heart: regular rate and rhythm Abdomen: soft, non-tender; bowel sounds normal; no masses,  no organomegaly Extremities: extremities normal, atraumatic, no cyanosis or edema Skin: Skin color,  texture, turgor normal. No rashes or lesions Lymph nodes: Cervical, supraclavicular, and axillary nodes normal. No abnormal inguinal nodes palpated Neurologic: Grossly normal   Pelvic: External genitalia:  no lesions              Urethra:  normal appearing urethra with no masses, tenderness or lesions              Bartholins and Skenes: normal                 Vagina: normal appearing atrophic vagina with normal color and discharge, no lesions              Cervix: no lesions               Bimanual Exam:  Uterus:  normal size, contour, position, consistency, mobility, non-tender              Adnexa: no mass, fullness, tenderness               Rectovaginal: Confirms               Anus:  normal sphincter tone, no lesions  Chaperone was present for exam.  A:  Well Woman with normal exam  Osteopenia  P:   DEXA in 1/18  Labs and immunizations with her primary  Mammogram is UTD  Colonoscopy in 1/18  Pap with hpv  Discussed breast self exam  Discussed calcium and vit D intake

## 2016-03-05 NOTE — Patient Instructions (Signed)

## 2016-03-09 LAB — IPS PAP TEST WITH HPV

## 2016-04-08 ENCOUNTER — Other Ambulatory Visit: Payer: Self-pay | Admitting: Internal Medicine

## 2016-04-08 DIAGNOSIS — E782 Mixed hyperlipidemia: Secondary | ICD-10-CM

## 2016-04-21 ENCOUNTER — Telehealth: Payer: Self-pay | Admitting: General Practice

## 2016-04-21 DIAGNOSIS — E782 Mixed hyperlipidemia: Secondary | ICD-10-CM

## 2016-04-21 NOTE — Telephone Encounter (Signed)
Pt says that she will be out of her medication pravastatin Before her appt. She would like to know If provider would refill before ? Advised that provider typically likes to see the pt before refills. Not showing any sooner appt's for pt. Please advise?

## 2016-04-22 ENCOUNTER — Encounter: Payer: Self-pay | Admitting: Family Medicine

## 2016-04-22 MED ORDER — PRAVASTATIN SODIUM 20 MG PO TABS: ORAL_TABLET | ORAL | 3 refills | Status: DC

## 2016-04-24 ENCOUNTER — Ambulatory Visit (AMBULATORY_SURGERY_CENTER): Payer: Self-pay | Admitting: *Deleted

## 2016-04-24 VITALS — Ht 63.0 in | Wt 131.8 lb

## 2016-04-24 DIAGNOSIS — Z1211 Encounter for screening for malignant neoplasm of colon: Secondary | ICD-10-CM

## 2016-04-24 MED ORDER — NA SULFATE-K SULFATE-MG SULF 17.5-3.13-1.6 GM/177ML PO SOLN: ORAL | 0 refills | Status: DC

## 2016-04-24 NOTE — Progress Notes (Signed)
Pt denies allergies to eggs or soy products. Denies difficulty with sedation or anesthesia. Denies any diet or weight loss medications. Denies use of supplemental oxygen.  Emmi instructions given for procedure.  

## 2016-05-08 ENCOUNTER — Ambulatory Visit (AMBULATORY_SURGERY_CENTER): Payer: No Typology Code available for payment source | Admitting: Gastroenterology

## 2016-05-08 ENCOUNTER — Encounter: Payer: Self-pay | Admitting: Gastroenterology

## 2016-05-08 VITALS — BP 113/71 | HR 55 | Temp 98.9°F | Resp 11 | Ht 63.0 in | Wt 131.0 lb

## 2016-05-08 DIAGNOSIS — D124 Benign neoplasm of descending colon: Secondary | ICD-10-CM

## 2016-05-08 DIAGNOSIS — Z1211 Encounter for screening for malignant neoplasm of colon: Secondary | ICD-10-CM | POA: Diagnosis not present

## 2016-05-08 DIAGNOSIS — Z1212 Encounter for screening for malignant neoplasm of rectum: Secondary | ICD-10-CM

## 2016-05-08 MED ORDER — SODIUM CHLORIDE 0.9 % IV SOLN: 500.0000 mL | INTRAVENOUS | Status: DC

## 2016-05-08 NOTE — Op Note (Signed)
[place] Patient Name: Shannon West Procedure Date: 05/08/2016 1:14 PM MRN: [id number] Endoscopist: Milus Banister , MD Age: 61 Referring MD:  Date of Birth: 02/24/1956 Gender: Female Account #: [id number] Procedure:                Colonoscopy Indications:              Screening for colorectal malignant neoplasm;                            colonoscopy 2007 no polyps Medicines:                Monitored Anesthesia Care Procedure:                Pre-Anesthesia Assessment:                           - Prior to the procedure, a History and Physical                            was performed, and patient medications and                            allergies were reviewed. The patient's tolerance of                            previous anesthesia was also reviewed. The risks                            and benefits of the procedure and the sedation                            options and risks were discussed with the patient.                            All questions were answered, and informed consent                            was obtained. Prior Anticoagulants: The patient has                            taken no previous anticoagulant or antiplatelet                            agents. ASA Grade Assessment: II - A patient with                            mild systemic disease. After reviewing the risks                            and benefits, the patient was deemed in                            satisfactory condition to undergo the procedure.  After obtaining informed consent, the colonoscope                            was passed under direct vision. Throughout the                            procedure, the patient's blood pressure, pulse, and                            oxygen saturations were monitored continuously. The                            Model CF-HQ190L [id number]) scope was introduced                            through the anus and advanced to the  the cecum,                            identified by appendiceal orifice and ileocecal                            valve. The colonoscopy was performed without                            difficulty. The patient tolerated the procedure                            well. The quality of the bowel preparation was                            excellent. The ileocecal valve, appendiceal                            orifice, and rectum were photographed. Scope In: 1:22:58 PM Scope Out: 1:35:15 PM Scope Withdrawal Time: 0 hours 6 minutes 50 seconds  Total Procedure Duration: 0 hours 12 minutes 17 seconds  Findings:                 A 3 mm polyp was found in the descending colon. The                            polyp was sessile. The polyp was removed with a                            cold snare. Resection and retrieval were complete.                           The exam was otherwise without abnormality on                            direct and retroflexion views. Complications:            No immediate complications. Estimated blood loss:  None. Estimated Blood Loss:     Estimated blood loss: none. Impression:               - One 3 mm polyp in the descending colon, removed                            with a cold snare. Resected and retrieved.                           - The examination was otherwise normal on direct                            and retroflexion views. Recommendation:           - Patient has a contact number available for                            emergencies. The signs and symptoms of potential                            delayed complications were discussed with the                            patient. Return to normal activities tomorrow.                            Written discharge instructions were provided to the                            patient.                           - Resume previous diet.                           - Continue present medications.                            You will receive a letter within 2-3 weeks with the                            pathology results and my final recommendations.                           If the polyp(s) is proven to be 'pre-cancerous' on                            pathology, you will need repeat colonoscopy in 5                            years. If the polyp(s) is NOT 'precancerous' on                            pathology then you should repeat colon cancer  screening in 10 years with colonoscopy without need                            for colon cancer screening by any method prior to                            then (including stool testing). Milus Banister, MD 05/08/2016 1:37:17 PM This report has been signed electronically.

## 2016-05-08 NOTE — Progress Notes (Signed)
A and O x3. Report to RN. Tolerated MAC anesthesia well.

## 2016-05-08 NOTE — Patient Instructions (Signed)
YOU HAD AN ENDOSCOPIC PROCEDURE TODAY AT THE [facility]:   Refer to the procedure report that was given to you for any specific questions about what was found during the examination.  If the procedure report does not answer your questions, please call your gastroenterologist to clarify.  If you requested that your care partner not be given the details of your procedure findings, then the procedure report has been included in a sealed envelope for you to review at your convenience later.  YOU SHOULD EXPECT: Some feelings of bloating in the abdomen. Passage of more gas than usual.  Walking can help get rid of the air that was put into your GI tract during the procedure and reduce the bloating. If you had a lower endoscopy (such as a colonoscopy or flexible sigmoidoscopy) you may notice spotting of blood in your stool or on the toilet paper. If you underwent a bowel prep for your procedure, you may not have a normal bowel movement for a few days.  Please Note:  You might notice some irritation and congestion in your nose or some drainage.  This is from the oxygen used during your procedure.  There is no need for concern and it should clear up in a day or so.  SYMPTOMS TO REPORT IMMEDIATELY:   Following lower endoscopy (colonoscopy or flexible sigmoidoscopy):  Excessive amounts of blood in the stool  Significant tenderness or worsening of abdominal pains  Swelling of the abdomen that is new, acute  Fever of 100F or higher  For urgent or emergent issues, a gastroenterologist can be reached at any hour by calling [phone].   DIET:  We do recommend a small meal at first, but then you may proceed to your regular diet.  Drink plenty of fluids but you should avoid alcoholic beverages for 24 hours.  ACTIVITY:  You should plan to take it easy for the rest of today and you should NOT DRIVE or use heavy machinery until tomorrow (because of the sedation medicines used during the test).     FOLLOW UP: Our staff will call the number listed on your records the next business day following your procedure to check on you and address any questions or concerns that you may have regarding the information given to you following your procedure. If we do not reach you, we will leave a message.  However, if you are feeling well and you are not experiencing any problems, there is no need to return our call.  We will assume that you have returned to your regular daily activities without incident.  If any biopsies were taken you will be contacted by phone or by letter within the next 1-3 weeks.  Please call us at [phone] if you have not heard about the biopsies in 3 weeks.   SIGNATURES/CONFIDENTIALITY: You and/or your care partner have signed paperwork which will be entered into your electronic medical record.  These signatures attest to the fact that that the information above on your After Visit Summary has been reviewed and is understood.  Full responsibility of the confidentiality of this discharge information lies with you and/or your care-partner.  Await pathology  Please read over handout about polyps  Please continue your normal medications 

## 2016-05-11 ENCOUNTER — Telehealth: Payer: Self-pay

## 2016-05-11 NOTE — Telephone Encounter (Signed)
  Follow up Call-  Call back number 05/08/2016  Post procedure Call Back phone  # [phone]  Permission to leave phone message Yes  Some recent data might be hidden    Patient was called for follow up after her procedure on 05/08/2016. No answer at the number given for follow up phone call. A message was left on the answering machine.

## 2016-05-11 NOTE — Telephone Encounter (Signed)
  Follow up Call-  Call back number 05/08/2016  Post procedure Call Back phone  # [phone]  Permission to leave phone message Yes  Some recent data might be hidden     Patient questions:  Do you have a fever, pain , or abdominal swelling? No. Pain Score  0 *  Have you tolerated food without any problems? Yes.    Have you been able to return to your normal activities? Yes.    Do you have any questions about your discharge instructions: Diet   No. Medications  No. Follow up visit  No.  Do you have questions or concerns about your Care? No.  Actions: * If pain score is 4 or above: No action needed, pain <4.

## 2016-05-13 ENCOUNTER — Ambulatory Visit
Admission: RE | Admit: 2016-05-13 | Discharge: 2016-05-13 | Disposition: A | Discharge status: Home / Self Care | Payer: No Typology Code available for payment source | Source: Ambulatory Visit | Attending: Obstetrics and Gynecology | Admitting: Obstetrics and Gynecology

## 2016-05-13 DIAGNOSIS — M8580 Other specified disorders of bone density and structure, unspecified site: Secondary | ICD-10-CM

## 2016-05-13 DIAGNOSIS — E2839 Other primary ovarian failure: Secondary | ICD-10-CM

## 2016-05-17 ENCOUNTER — Encounter: Payer: Self-pay | Admitting: Gastroenterology

## 2016-05-19 ENCOUNTER — Encounter: Payer: Self-pay | Admitting: Family Medicine

## 2016-05-21 ENCOUNTER — Ambulatory Visit (INDEPENDENT_AMBULATORY_CARE_PROVIDER_SITE_OTHER): Payer: No Typology Code available for payment source | Admitting: Family Medicine

## 2016-05-21 ENCOUNTER — Encounter: Payer: Self-pay | Admitting: Emergency Medicine

## 2016-05-21 ENCOUNTER — Encounter: Payer: Self-pay | Admitting: Family Medicine

## 2016-05-21 VITALS — BP 118/82 | HR 81 | Temp 98.2°F | Ht 62.5 in | Wt 129.4 lb

## 2016-05-21 DIAGNOSIS — Z1159 Encounter for screening for other viral diseases: Secondary | ICD-10-CM

## 2016-05-21 DIAGNOSIS — E785 Hyperlipidemia, unspecified: Secondary | ICD-10-CM

## 2016-05-21 DIAGNOSIS — Z131 Encounter for screening for diabetes mellitus: Secondary | ICD-10-CM | POA: Diagnosis not present

## 2016-05-21 DIAGNOSIS — Z0000 Encounter for general adult medical examination without abnormal findings: Secondary | ICD-10-CM

## 2016-05-21 DIAGNOSIS — Z23 Encounter for immunization: Secondary | ICD-10-CM | POA: Diagnosis not present

## 2016-05-21 DIAGNOSIS — Z1329 Encounter for screening for other suspected endocrine disorder: Secondary | ICD-10-CM

## 2016-05-21 DIAGNOSIS — Z130 Encounter for screening for diseases of the blood and blood-forming organs and certain disorders involving the immune mechanism: Secondary | ICD-10-CM

## 2016-05-21 NOTE — Patient Instructions (Signed)
It was very nice to meet you today- please come in for fasting labs at your convenience tomorrow.  I will be in touch with your results asap You got your "tdap" tetanus booster today- you will be due for a plain tetanus shot in 10 years

## 2016-05-21 NOTE — Progress Notes (Signed)
[place] [facility] [address], [place], [place] [address] [phone]  Date:  05/21/2016   Name:  Shannon West   DOB:  1956-01-11   MRN:  [id number]  PCP:  Lamar Blinks, MD    Chief Complaint: Establish Care (Pt here est care. Former pt of Dr. Linna Darner. Flu vaccine 03/04/2016. )   History of Present Illness:  Shannon West is a 61 y.o. very pleasant female patient who presents with the following:  Here to establish care.  She had been a pt of Dr. Linna Darner who is retiring   Colonoscopy on 05-08-2016 with Dr. Ardis Hughs, waiting on results.  Removed 1 polyp. Mammogram on 09-12-2015, dense tissue, repeat in 1 year.  Patient planning on repeating in 1-2 years. Dexa scan on 05-13-2016:  Results: The probability of a major osteoporotic fracture is 11.2 % within the next ten years.  The probability of a hip fracture is 2.1 % within the next ten years.  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  Not fasting today and wants to come back for labs at a later date  No complaints today.  She is feeling well.  She and her husband are both active and in good health, and enjoy traveling with their motor home I recently refilled her pravachol for her recetnly   Patient Active Problem List   Diagnosis Date Noted  . 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 Transaminitis 07/13/2011  . Factor 5 Leiden mutation, heterozygous ([place]) 06/23/2011  . VITAMIN D DEFICIENCY 03/24/2010  . HYPERLIPIDEMIA 03/03/2007  . GERD 09/07/2006  . Osteopenia 09/07/2006    Past Medical History:  Diagnosis Date  . Clotting disorder ([place])   . Factor 5 Leiden mutation, heterozygous [facility])    Dr Beryle Beams, Hematology  . GERD (gastroesophageal reflux disease)   . History of anemia   . Hyperlipemia   . Hypochromic-microcytic anemia 07/13/2011   Hb 10.9 MCV 83.5   . Osteopenia    BMD @ [employer]  . Transaminitis  07/13/2011   Borderline  SGPT 38  06/18/11 normal SGOT  . Vitamin D deficiency 2008    Past Surgical History:  Procedure Laterality Date  . CARPAL TUNNEL RELEASE Right    RUE  . COLONOSCOPY  2007   Negative; Dr Carlean Purl  . UPPER GASTROINTESTINAL ENDOSCOPY  2007   hiatal hernia    Social History  Substance Use Topics  . Smoking status: Never Smoker  . Smokeless tobacco: Never Used  . Alcohol use No     Comment: rarely    Family History  Problem Relation Age of Onset  . Hyperlipidemia Father   . Aortic aneurysm Father     smoker  . Osteoporosis Mother   . Hyperlipidemia Mother   . Hypothyroidism Mother   . CVA Mother 78  . Diabetes Brother     died in sleep in context of pulmonary infection @ 5  . Hyperlipidemia Brother   . Clotting disorder Brother     factor 5 deficiency with DVT  . Diabetes Maternal Grandmother   . Heart attack Neg Hx     No Known Allergies  Medication list has been reviewed and updated.  Current Outpatient Prescriptions on File Prior to Visit  Medication Sig Dispense Refill  . aspirin EC 81 MG tablet Take 81 mg by mouth daily.    . calcium carbonate (OS-CAL) 600 MG TABS Take 600 mg  by mouth daily.     . Cholecalciferol (VITAMIN D) 2000 UNITS CAPS Take 2,000 Units by mouth daily. Take on Mon,Wed,Fri 30 capsule   . Omega-3 Fatty Acids (FISH OIL) 1000 MG CAPS Take 1,000 mg by mouth daily.     . pravastatin (PRAVACHOL) 20 MG tablet TAKE 1 TABLET BY MOUTH EVERY DAY 90 tablet 3   No current facility-administered medications on file prior to visit.     Review of Systems:  Review of Systems  Constitutional: Negative for chills, diaphoresis, fever, malaise/fatigue and weight loss.  HENT: Negative for hearing loss.   Eyes: Negative for blurred vision.  Respiratory: Negative for cough and shortness of breath.   Cardiovascular: Negative for chest pain and palpitations.  Gastrointestinal: Negative for abdominal pain, constipation, diarrhea,  heartburn, nausea and vomiting.  Genitourinary: Negative for dysuria, frequency and urgency.  Musculoskeletal: Negative for myalgias.  Neurological: Negative for dizziness, tingling, weakness and headaches.  Psychiatric/Behavioral: Negative for depression. The patient is not nervous/anxious.      Physical Examination: Vitals:   05/21/16 1610  BP: 118/82  Pulse: 81  Temp: 98.2 F (36.8 C)   Vitals:   05/21/16 1610  Weight: 129 lb 6.4 oz (58.7 kg)  Height: 5' 2.5" (1.588 m)   Body mass index is 23.29 kg/m. Ideal Body Weight: Weight in (lb) to have BMI = 25: 138.6  Physical Examination: General appearance - alert, well appearing, and in no distress and oriented to person, place, and time Mental status - alert, oriented to person, place, and time Eyes - pupils equal and reactive, extraocular eye movements intact Ears - bilateral TM's and external ear canals normal Nose - normal and patent, no erythema, discharge or polyps Mouth - mucous membranes moist, pharynx normal without lesions Neck - supple, no significant adenopathy Lymphatics - no palpable lymphadenopathy, no hepatosplenomegaly Chest - clear to auscultation, no wheezes, rales or rhonchi, symmetric air entry Heart - normal rate, regular rhythm, normal S1, S2, no murmurs, rubs, clicks or gallops Abdomen - soft, nontender, nondistended, no masses or organomegaly Back exam - full range of motion, no tenderness, palpable spasm or pain on motion Neurological - alert, oriented, normal speech, no focal findings or movement disorder noted Musculoskeletal - no joint tenderness, deformity or swelling Extremities - peripheral pulses normal, no pedal edema, no clubbing or cyanosis Skin - normal coloration and turgor, no rashes, no suspicious skin lesions noted   Assessment and Plan: Dyslipidemia - Plan: Lipid panel  Immunization due - Plan: Tdap vaccine greater than or equal to 7yo IM  Screening for deficiency anemia - Plan:  CBC  Screening for thyroid disorder - Plan: TSH  Screening for diabetes mellitus - Plan: Comprehensive metabolic panel, Hemoglobin A1c  Encounter for hepatitis C screening test for low risk patient - Plan: Hepatitis C antibody  Here today to establish care.  We will have her come back to have labs including a FLP to monitor her lipids Over 10 years since last tetanus booster:  Tdap today  Hyperlipidemia: Lipid Panel Screening for deficiency anemia: CBC Screening for thyroid disorder: TSH Screening for diabetes mellitus: Hgb A1C Hepatitis C screening: Hepatitis C Antibody  Patient instructions:  It was very nice to meet you today- please come in for fasting labs at your convenience tomorrow.  I will be in touch with your results asap You got your "tdap" tetanus booster today- you will be due for a plain tetanus shot in 10 years   Signed Lamar Blinks, MD

## 2016-05-21 NOTE — Progress Notes (Signed)
Pre visit review using our clinic review tool, if applicable. No additional management support is needed unless otherwise documented below in the visit note. 

## 2016-05-22 ENCOUNTER — Other Ambulatory Visit: Payer: No Typology Code available for payment source

## 2016-05-22 ENCOUNTER — Encounter: Payer: Self-pay | Admitting: Family Medicine

## 2016-05-22 LAB — LIPID PANEL
CHOL/HDL RATIO: 4
Cholesterol: 175 mg/dL (ref 0–200)
HDL: 44.6 mg/dL (ref >39.00)
LDL CALC: 112 mg/dL — AB (ref 0–99)
NONHDL: 130.76
Triglycerides: 95 mg/dL (ref 0.0–149.0)
VLDL: 19 mg/dL (ref 0.0–40.0)

## 2016-05-22 LAB — COMPREHENSIVE METABOLIC PANEL
ALT: 19 U/L (ref 0–35)
AST: 22 U/L (ref 0–37)
Albumin: 3.9 g/dL (ref 3.5–5.2)
Alkaline Phosphatase: 65 U/L (ref 39–117)
BUN: 20 mg/dL (ref 6–23)
CO2: 30 meq/L (ref 19–32)
Calcium: 9.1 mg/dL (ref 8.4–10.5)
Chloride: 105 mEq/L (ref 96–112)
Creatinine, Ser: 0.67 mg/dL (ref 0.40–1.20)
GFR: 94.95 mL/min (ref >60.00)
GLUCOSE: 84 mg/dL (ref 70–99)
POTASSIUM: 4.1 meq/L (ref 3.5–5.1)
SODIUM: 139 meq/L (ref 135–145)
Total Bilirubin: 0.4 mg/dL (ref 0.2–1.2)
Total Protein: 7.1 g/dL (ref 6.0–8.3)

## 2016-05-22 LAB — CBC
HEMATOCRIT: 39.5 % (ref 36.0–46.0)
HEMOGLOBIN: 13.4 g/dL (ref 12.0–15.0)
MCHC: 33.8 g/dL (ref 30.0–36.0)
MCV: 85 fl (ref 78.0–100.0)
Platelets: 222 10*3/uL (ref 150.0–400.0)
RBC: 4.64 Mil/uL (ref 3.87–5.11)
RDW: 15.7 % — AB (ref 11.5–15.5)
WBC: 6.4 10*3/uL (ref 4.0–10.5)

## 2016-05-22 LAB — HEPATITIS C ANTIBODY: HCV Ab: NEGATIVE

## 2016-05-22 LAB — HEMOGLOBIN A1C: Hgb A1c MFr Bld: 5.8 % (ref 4.6–6.5)

## 2016-05-22 LAB — TSH: TSH: 2.85 u[IU]/mL (ref 0.35–4.50)

## 2016-06-01 ENCOUNTER — Other Ambulatory Visit: Payer: Self-pay | Admitting: Internal Medicine

## 2016-06-01 DIAGNOSIS — E782 Mixed hyperlipidemia: Secondary | ICD-10-CM

## 2016-06-01 NOTE — Telephone Encounter (Signed)
Routing to patient's new pcp to handle 

## 2017-03-15 ENCOUNTER — Encounter: Payer: Self-pay | Admitting: Family Medicine

## 2017-05-18 ENCOUNTER — Telehealth: Payer: Self-pay

## 2017-05-18 ENCOUNTER — Encounter: Payer: Self-pay | Admitting: Family Medicine

## 2017-05-18 DIAGNOSIS — E7849 Other hyperlipidemia: Secondary | ICD-10-CM

## 2017-05-18 DIAGNOSIS — Z131 Encounter for screening for diabetes mellitus: Secondary | ICD-10-CM

## 2017-05-18 DIAGNOSIS — Z130 Encounter for screening for diseases of the blood and blood-forming organs and certain disorders involving the immune mechanism: Secondary | ICD-10-CM

## 2017-05-18 NOTE — Telephone Encounter (Signed)
Copied from [place]. Topic: General - Other >> May 18, 2017  3:26 PM Yvette Rack wrote: Reason for CRM: patient have a CPE with [facility] on 06-03-17 at 2:45 and would like to come into the lab a week early to get labs drawn before her physical could you please put orders in so pt can be scheduled a Lab visit  Route to department's PEC Pool.

## 2017-05-18 NOTE — Telephone Encounter (Signed)
Please advise 

## 2017-05-26 ENCOUNTER — Other Ambulatory Visit (INDEPENDENT_AMBULATORY_CARE_PROVIDER_SITE_OTHER): Payer: No Typology Code available for payment source

## 2017-05-26 DIAGNOSIS — Z131 Encounter for screening for diabetes mellitus: Secondary | ICD-10-CM

## 2017-05-26 DIAGNOSIS — E7849 Other hyperlipidemia: Secondary | ICD-10-CM

## 2017-05-26 DIAGNOSIS — Z130 Encounter for screening for diseases of the blood and blood-forming organs and certain disorders involving the immune mechanism: Secondary | ICD-10-CM

## 2017-05-26 LAB — CBC
HCT: 41.7 % (ref 36.0–46.0)
HEMOGLOBIN: 13.9 g/dL (ref 12.0–15.0)
MCHC: 33.2 g/dL (ref 30.0–36.0)
MCV: 86.4 fl (ref 78.0–100.0)
PLATELETS: 214 10*3/uL (ref 150.0–400.0)
RBC: 4.83 Mil/uL (ref 3.87–5.11)
RDW: 16.6 % — ABNORMAL HIGH (ref 11.5–15.5)
WBC: 4.6 10*3/uL (ref 4.0–10.5)

## 2017-05-26 LAB — LIPID PANEL
CHOL/HDL RATIO: 4
Cholesterol: 165 mg/dL (ref 0–200)
HDL: 46.2 mg/dL (ref >39.00)
LDL CALC: 99 mg/dL (ref 0–99)
NONHDL: 118.71
Triglycerides: 99 mg/dL (ref 0.0–149.0)
VLDL: 19.8 mg/dL (ref 0.0–40.0)

## 2017-05-26 LAB — COMPREHENSIVE METABOLIC PANEL
ALT: 17 U/L (ref 0–35)
AST: 21 U/L (ref 0–37)
Albumin: 4.1 g/dL (ref 3.5–5.2)
Alkaline Phosphatase: 57 U/L (ref 39–117)
BUN: 21 mg/dL (ref 6–23)
CALCIUM: 9.3 mg/dL (ref 8.4–10.5)
CHLORIDE: 100 meq/L (ref 96–112)
CO2: 33 mEq/L — ABNORMAL HIGH (ref 19–32)
Creatinine, Ser: 0.77 mg/dL (ref 0.40–1.20)
GFR: 80.6 mL/min (ref >60.00)
Glucose, Bld: 97 mg/dL (ref 70–99)
POTASSIUM: 4.5 meq/L (ref 3.5–5.1)
Sodium: 138 mEq/L (ref 135–145)
Total Bilirubin: 0.6 mg/dL (ref 0.2–1.2)
Total Protein: 7.5 g/dL (ref 6.0–8.3)

## 2017-05-26 LAB — HEMOGLOBIN A1C: HEMOGLOBIN A1C: 5.9 % (ref 4.6–6.5)

## 2017-06-01 NOTE — Progress Notes (Signed)
[place] at [facility] [address], [place], [place] [address] [phone]  Date:  06/03/2017   Name:  Shannon West   DOB:  1955-04-25   MRN:  [id number]  PCP:  Shannon Mclean, MD    Chief Complaint: No chief complaint on file.   History of Present Illness:  Shannon West is a 62 y.o. very pleasant female patient who presents with the following:  CPE today History of factor V leiden heterozygote, vit D def, hyperlipidemia, osteopenia  Last visit here about a year ago for her CPE  Pap: 2017- per her GYN Mammo: 2017- per her GYN Colon: 1/18 Dexa: 1/18 Labs: done earlier this month to discuss today Flu: done  They have been traveling a lot with the RV- they are planning a big trip later on this spring across much of the [place] She is feeling very well, is active and happy She does have pre-diabetes with her last 2 A1c over 5.7.  She is trying to watch her sugar and she does exercise. She is not overweight Patient Active Problem List   Diagnosis Date Noted  . 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 Transaminitis 07/13/2011  . Factor 5 Leiden mutation, heterozygous ([place]) 06/23/2011  . VITAMIN D DEFICIENCY 03/24/2010  . HYPERLIPIDEMIA 03/03/2007  . GERD 09/07/2006  . Osteopenia 09/07/2006    Past Medical History:  Diagnosis Date  . Clotting disorder ([place])   . Factor 5 Leiden mutation, heterozygous [facility])    Dr Beryle Beams, Hematology  . GERD (gastroesophageal reflux disease)   . History of anemia   . Hyperlipemia   . Hypochromic-microcytic anemia 07/13/2011   Hb 10.9 MCV 83.5   . Osteopenia    BMD @ [facility]  . Transaminitis 07/13/2011   Borderline  SGPT 38  06/18/11 normal SGOT  . Vitamin D deficiency 2008    Past Surgical History:  Procedure Laterality Date  . CARPAL TUNNEL RELEASE Right    RUE  . COLONOSCOPY  2007   Negative; Dr Carlean Purl  . UPPER GASTROINTESTINAL ENDOSCOPY  2007   hiatal hernia     Social History   Tobacco Use  . Smoking status: Never Smoker  . Smokeless tobacco: Never Used  Substance Use Topics  . Alcohol use: No    Alcohol/week: 0.0 oz    Comment: rarely  . Drug use: No    Family History  Problem Relation Age of Onset  . Hyperlipidemia Father   . Aortic aneurysm Father        smoker  . Osteoporosis Mother   . Hyperlipidemia Mother   . Hypothyroidism Mother   . CVA Mother 34  . Diabetes Brother        died in sleep in context of pulmonary infection @ 2  . Hyperlipidemia Brother   . Clotting disorder Brother        factor 5 deficiency with DVT  . Diabetes Maternal Grandmother   . Heart attack Neg Hx     No Known Allergies  Medication list has been reviewed and updated.  Current Outpatient Medications on File Prior to Visit  Medication Sig Dispense Refill  . aspirin EC 81 MG tablet Take 81 mg by mouth daily.    . calcium carbonate (OS-CAL) 600 MG TABS Take 600 mg by mouth daily.     . Cholecalciferol (VITAMIN D) 2000 UNITS CAPS Take 2,000 Units by mouth daily. Take on Mon,Wed,Fri 30 capsule   .  Omega-3 Fatty Acids (FISH OIL) 1000 MG CAPS Take 1,000 mg by mouth daily.     . pravastatin (PRAVACHOL) 20 MG tablet TAKE ONE TABLET BY MOUTH ONCE DAILY 90 tablet 3   No current facility-administered medications on file prior to visit.    1  Review of Systems:  As per HPI- otherwise negative. No skin concerns No breast concerns No CP or SOB with exercise    Physical Examination: Vitals:   06/03/17 1441  BP: 110/72  Pulse: 60  Resp: 16  Temp: 98.5 F (36.9 C)  SpO2: 97%   Vitals:   06/03/17 1441  Weight: 126 lb (57.2 kg)  Height: 5' 2.5" (1.588 m)   Body mass index is 22.68 kg/m. Ideal Body Weight: Weight in (lb) to have BMI = 25: 138.6  GEN: WDWN, NAD, Non-toxic, A & O x 3, slim build, looks well  HEENT: Atraumatic, Normocephalic. Neck supple. No masses, No LAD. Bilateral TM wnl, oropharynx normal.  PEERL,EOMI.   Ears and  Nose: No external deformity. CV: RRR, No M/G/R. No JVD. No thrill. No extra heart sounds. PULM: CTA B, no wheezes, crackles, rhonchi. No retractions. No resp. distress. No accessory muscle use. ABD: S, NT, ND. No rebound. No HSM. EXTR: No c/c/e NEURO Normal gait.  Normal DTR of all extremities  PSYCH: Normally interactive. Conversant. Not depressed or anxious appearing.  Calm demeanor.    Assessment and Plan: Physical exam  Screening for breast cancer - Plan: MM SCREENING BREAST TOMO BILATERAL  Pre-diabetes  HYPERLIPIDEMIA - Plan: pravastatin (PRAVACHOL) 20 MG tablet  Here today for a CPE She is doing well, is very active and enjoying life Suggested that she have the shingrix series at her convenience  Refilled her pravachol She will continue to maintain her weight and we will monitor her A1c annually   Signed Lamar Blinks, MD  Results for orders placed or performed in visit on 05/26/17  Lipid panel  Result Value Ref Range   Cholesterol 165 0 - 200 mg/dL   Triglycerides 99.0 0.0 - 149.0 mg/dL   HDL 46.20 >39.00 mg/dL   VLDL 19.8 0.0 - 40.0 mg/dL   LDL Cholesterol 99 0 - 99 mg/dL   Total CHOL/HDL Ratio 4    NonHDL 118.71   Hemoglobin A1c  Result Value Ref Range   Hgb A1c MFr Bld 5.9 4.6 - 6.5 %  Comprehensive metabolic panel  Result Value Ref Range   Sodium 138 135 - 145 mEq/L   Potassium 4.5 3.5 - 5.1 mEq/L   Chloride 100 96 - 112 mEq/L   CO2 33 (H) 19 - 32 mEq/L   Glucose, Bld 97 70 - 99 mg/dL   BUN 21 6 - 23 mg/dL   Creatinine, Ser 0.77 0.40 - 1.20 mg/dL   Total Bilirubin 0.6 0.2 - 1.2 mg/dL   Alkaline Phosphatase 57 39 - 117 U/L   AST 21 0 - 37 U/L   ALT 17 0 - 35 U/L   Total Protein 7.5 6.0 - 8.3 g/dL   Albumin 4.1 3.5 - 5.2 g/dL   Calcium 9.3 8.4 - 10.5 mg/dL   GFR 80.60 >60.00 mL/min  CBC  Result Value Ref Range   WBC 4.6 4.0 - 10.5 K/uL   RBC 4.83 3.87 - 5.11 Mil/uL   Platelets 214.0 150.0 - 400.0 K/uL   Hemoglobin 13.9 12.0 - 15.0 g/dL   HCT  41.7 36.0 - 46.0 %   MCV 86.4 78.0 - 100.0 fl  MCHC 33.2 30.0 - 36.0 g/dL   RDW 16.6 (H) 11.5 - 15.5 %

## 2017-06-03 ENCOUNTER — Encounter: Payer: Self-pay | Admitting: Family Medicine

## 2017-06-03 ENCOUNTER — Ambulatory Visit (INDEPENDENT_AMBULATORY_CARE_PROVIDER_SITE_OTHER): Payer: No Typology Code available for payment source | Admitting: Family Medicine

## 2017-06-03 VITALS — BP 110/72 | HR 60 | Temp 98.5°F | Resp 16 | Ht 62.5 in | Wt 126.0 lb

## 2017-06-03 DIAGNOSIS — R7303 Prediabetes: Secondary | ICD-10-CM | POA: Diagnosis not present

## 2017-06-03 DIAGNOSIS — Z0000 Encounter for general adult medical examination without abnormal findings: Secondary | ICD-10-CM

## 2017-06-03 DIAGNOSIS — E782 Mixed hyperlipidemia: Secondary | ICD-10-CM | POA: Diagnosis not present

## 2017-06-03 DIAGNOSIS — Z1239 Encounter for other screening for malignant neoplasm of breast: Secondary | ICD-10-CM

## 2017-06-03 DIAGNOSIS — Z1231 Encounter for screening mammogram for malignant neoplasm of breast: Secondary | ICD-10-CM

## 2017-06-03 MED ORDER — PRAVASTATIN SODIUM 20 MG PO TABS: 20.0000 mg | ORAL_TABLET | Freq: Every day | ORAL | 3 refills | Status: DC

## 2017-06-03 NOTE — Patient Instructions (Addendum)
It was great to see you again- have a wonderful trip across the [place] this spring  Please call the [facility] and schedule your screening mammogram You do have pre-diabetes, but NOT diabetes.  We will continue to monitor this annually.  Simply continue to maintain your weight and stay active  Wt Readings from Last 3 Encounters:  06/03/17 126 lb (57.2 kg)  05/21/16 129 lb 6.4 oz (58.7 kg)  05/08/16 131 lb (59.4 kg)

## 2017-07-22 ENCOUNTER — Encounter: Payer: No Typology Code available for payment source | Admitting: Family Medicine

## 2018-02-02 ENCOUNTER — Other Ambulatory Visit: Payer: Self-pay | Admitting: Family Medicine

## 2018-02-02 DIAGNOSIS — Z1231 Encounter for screening mammogram for malignant neoplasm of breast: Secondary | ICD-10-CM

## 2018-02-03 ENCOUNTER — Ambulatory Visit (INDEPENDENT_AMBULATORY_CARE_PROVIDER_SITE_OTHER): Payer: No Typology Code available for payment source

## 2018-02-03 DIAGNOSIS — Z1231 Encounter for screening mammogram for malignant neoplasm of breast: Secondary | ICD-10-CM | POA: Diagnosis not present

## 2018-02-15 NOTE — Progress Notes (Signed)
[place] at [facility] [address], [place], [place] [address] [phone]  Date:  02/16/2018   Name:  Shannon West   DOB:  08-15-1955   MRN:  [id number]  PCP:  Darreld Mclean, MD    Chief Complaint: Visit for Pap Smear   History of Present Illness:  Shannon West is a 62 y.o. very pleasant female patient who presents with the following:  Here today for a pap - never had an abnl that she can recall  Last pap: 2017 Final Cytologic Interpretation:     Cervical, ThinLayer with Automated Imaging and Dual Review, CPT 88175    Negative for Intraepithelial Lesions or Malignancy.    ADEQUACY OF SPECIMEN:       Satisfactory for evaluation. The presence or absence of endocervical cells/transformation zone component cannot be determined due to atrophy.  HPV Results   High Risk HPV -  Not Detected  Reference Range = Not Detected    Flu: done already  Shingrix: discussed, she wishes to do later on   Last seen here in February for her annual physical: Here today for a CPE She is doing well, is very active and enjoying life Suggested that she have the shingrix series at her convenience  Refilled her pravachol She will continue to maintain her weight and we will monitor her A1c annually   Patient Active Problem List   Diagnosis Date Noted  . Pre-diabetes 06/03/2017  . 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 Transaminitis 07/13/2011  . Factor 5 Leiden mutation, heterozygous ([place]) 06/23/2011  . VITAMIN D DEFICIENCY 03/24/2010  . HYPERLIPIDEMIA 03/03/2007  . GERD 09/07/2006  . Osteopenia 09/07/2006    Past Medical History:  Diagnosis Date  . Clotting disorder ([place])   . Factor 5 Leiden mutation, heterozygous [facility])    Dr Beryle Beams, Hematology  . GERD (gastroesophageal reflux disease)   . History of anemia   . Hyperlipemia   . Hypochromic-microcytic anemia 07/13/2011   Hb 10.9 MCV 83.5   .  Osteopenia    BMD @ [facility]  . Transaminitis 07/13/2011   Borderline  SGPT 38  06/18/11 normal SGOT  . Vitamin D deficiency 2008    Past Surgical History:  Procedure Laterality Date  . CARPAL TUNNEL RELEASE Right    RUE  . COLONOSCOPY  2007   Negative; Dr Carlean Purl  . UPPER GASTROINTESTINAL ENDOSCOPY  2007   hiatal hernia    Social History   Tobacco Use  . Smoking status: Never Smoker  . Smokeless tobacco: Never Used  Substance Use Topics  . Alcohol use: No    Alcohol/week: 0.0 standard drinks    Comment: rarely  . Drug use: No    Family History  Problem Relation Age of Onset  . Hyperlipidemia Father   . Aortic aneurysm Father        smoker  . Osteoporosis Mother   . Hyperlipidemia Mother   . Hypothyroidism Mother   . CVA Mother 32  . Diabetes Brother        died in sleep in context of pulmonary infection @ 26  . Hyperlipidemia Brother   . Clotting disorder Brother        factor 5 deficiency with DVT  . Diabetes Maternal Grandmother   . Heart attack Neg Hx     No Known Allergies  Medication list has been reviewed and updated.  Current Outpatient Medications on File Prior to Visit  Medication Sig Dispense Refill  . aspirin EC 81 MG tablet Take 81 mg by mouth daily.    . calcium carbonate (OS-CAL) 600 MG TABS Take 600 mg by mouth daily.     . Cholecalciferol (VITAMIN D) 2000 UNITS CAPS Take 2,000 Units by mouth daily. Take on Mon,Wed,Fri 30 capsule   . Omega-3 Fatty Acids (FISH OIL) 1000 MG CAPS Take 1,000 mg by mouth daily.     . pravastatin (PRAVACHOL) 20 MG tablet Take 1 tablet (20 mg total) by mouth daily. 90 tablet 3  . vitamin B-12 (CYANOCOBALAMIN) 1000 MCG tablet Take 1,000 mcg by mouth daily.     No current facility-administered medications on file prior to visit.     Review of Systems:  As per HPI- otherwise negative.   Physical Examination: Vitals:   02/16/18 0951  BP: 122/80  Pulse: 68  Resp: 16  Temp: 97.9 F (36.6 C)  SpO2: 97%    Vitals:   02/16/18 0951  Weight: 128 lb (58.1 kg)  Height: 5' 2.5" (1.588 m)   Body mass index is 23.04 kg/m. Ideal Body Weight: Weight in (lb) to have BMI = 25: 138.6  GEN: WDWN, NAD, Non-toxic, A & O x 3 HEENT: Atraumatic, Normocephalic. Neck supple. No masses, No LAD. Ears and Nose: No external deformity. CV: RRR, No M/G/R. No JVD. No thrill. No extra heart sounds. PULM: CTA B, no wheezes, crackles, rhonchi. No retractions. No resp. distress. No accessory muscle use. EXTR: No c/c/e NEURO Normal gait.  Pelvic: normal, no vaginal lesions or discharge. Uterus normal, no CMT, no adnexal tendereness or masses     Assessment and Plan: Screening for cervical cancer - Plan: Cytology - PAP  Pap collected today- Will plan further follow- up pending labs. Discussed stopped pap screening at age 72 if she likes  Plans to get shingrix next year  Signed Lamar Blinks, MD

## 2018-02-16 ENCOUNTER — Encounter: Payer: Self-pay | Admitting: Family Medicine

## 2018-02-16 ENCOUNTER — Other Ambulatory Visit (HOSPITAL_COMMUNITY)
Admission: RE | Admit: 2018-02-16 | Discharge: 2018-02-16 | Disposition: A | Discharge status: Home / Self Care | Payer: No Typology Code available for payment source | Source: Ambulatory Visit | Attending: Family Medicine | Admitting: Family Medicine

## 2018-02-16 ENCOUNTER — Ambulatory Visit (INDEPENDENT_AMBULATORY_CARE_PROVIDER_SITE_OTHER): Payer: No Typology Code available for payment source | Admitting: Family Medicine

## 2018-02-16 VITALS — BP 122/80 | HR 68 | Temp 97.9°F | Resp 16 | Ht 62.5 in | Wt 128.0 lb

## 2018-02-16 DIAGNOSIS — Z124 Encounter for screening for malignant neoplasm of cervix: Secondary | ICD-10-CM | POA: Diagnosis present

## 2018-02-16 NOTE — Patient Instructions (Signed)
Good to see you today-  I will be in touch with your pap report asap  Please consider getting the shingrix vaccine at your convenience in the new year!

## 2018-02-17 ENCOUNTER — Encounter: Payer: Self-pay | Admitting: Family Medicine

## 2018-02-17 LAB — CYTOLOGY - PAP
DIAGNOSIS: NEGATIVE
HPV: NOT DETECTED

## 2018-07-06 ENCOUNTER — Other Ambulatory Visit: Payer: Self-pay | Admitting: Family Medicine

## 2018-07-06 DIAGNOSIS — E782 Mixed hyperlipidemia: Secondary | ICD-10-CM

## 2018-08-10 ENCOUNTER — Other Ambulatory Visit: Payer: Self-pay | Admitting: Family Medicine

## 2018-08-10 DIAGNOSIS — E782 Mixed hyperlipidemia: Secondary | ICD-10-CM

## 2019-05-03 ENCOUNTER — Other Ambulatory Visit: Payer: Self-pay

## 2019-05-03 NOTE — Patient Instructions (Addendum)
It was great to see you again today, I will be in touch with your labs ASAP Please get the shingles vaccine- Shingrix-at your convenience We ordered a bone density scan for you today    Health Maintenance, Female Adopting a healthy lifestyle and getting preventive care are important in promoting health and wellness. Ask your health care provider about:  The right schedule for you to have regular tests and exams.  Things you can do on your own to prevent diseases and keep yourself healthy. What should I know about diet, weight, and exercise? Eat a healthy diet   Eat a diet that includes plenty of vegetables, fruits, low-fat dairy products, and lean protein.  Do not eat a lot of foods that are high in solid fats, added sugars, or sodium. Maintain a healthy weight Body mass index (BMI) is used to identify weight problems. It estimates body fat based on height and weight. Your health care provider can help determine your BMI and help you achieve or maintain a healthy weight. Get regular exercise Get regular exercise. This is one of the most important things you can do for your health. Most adults should:  Exercise for at least 150 minutes each week. The exercise should increase your heart rate and make you sweat (moderate-intensity exercise).  Do strengthening exercises at least twice a week. This is in addition to the moderate-intensity exercise.  Spend less time sitting. Even light physical activity can be beneficial. Watch cholesterol and blood lipids Have your blood tested for lipids and cholesterol at 65 years of age, then have this test every 5 years. Have your cholesterol levels checked more often if:  Your lipid or cholesterol levels are high.  You are older than 65 years of age.  You are at high risk for heart disease. What should I know about cancer screening? Depending on your health history and family history, you may need to have cancer screening at various ages. This  may include screening for:  Breast cancer.  Cervical cancer.  Colorectal cancer.  Skin cancer.  Lung cancer. What should I know about heart disease, diabetes, and high blood pressure? Blood pressure and heart disease  High blood pressure causes heart disease and increases the risk of stroke. This is more likely to develop in people who have high blood pressure readings, are of African descent, or are overweight.  Have your blood pressure checked: ? Every 3-5 years if you are 75-52 years of age. ? Every year if you are 80 years old or older. Diabetes Have regular diabetes screenings. This checks your fasting blood sugar level. Have the screening done:  Once every three years after age 72 if you are at a normal weight and have a low risk for diabetes.  More often and at a younger age if you are overweight or have a high risk for diabetes. What should I know about preventing infection? Hepatitis B If you have a higher risk for hepatitis B, you should be screened for this virus. Talk with your health care provider to find out if you are at risk for hepatitis B infection. Hepatitis C Testing is recommended for:  Everyone born from [birth year] through 1966.  Anyone with known risk factors for hepatitis C. Sexually transmitted infections (STIs)  Get screened for STIs, including gonorrhea and chlamydia, if: ? You are sexually active and are younger than 65 years of age. ? You are older than 65 years of age and your health care provider tells you  that you are at risk for this type of infection. ? Your sexual activity has changed since you were last screened, and you are at increased risk for chlamydia or gonorrhea. Ask your health care provider if you are at risk.  Ask your health care provider about whether you are at high risk for HIV. Your health care provider may recommend a prescription medicine to help prevent HIV infection. If you choose to take medicine to prevent HIV, you should  first get tested for HIV. You should then be tested every 3 months for as long as you are taking the medicine. Pregnancy  If you are about to stop having your period (premenopausal) and you may become pregnant, seek counseling before you get pregnant.  Take 400 to 800 micrograms (mcg) of folic acid every day if you become pregnant.  Ask for birth control (contraception) if you want to prevent pregnancy. Osteoporosis and menopause Osteoporosis is a disease in which the bones lose minerals and strength with aging. This can result in bone fractures. If you are 33 years old or older, or if you are at risk for osteoporosis and fractures, ask your health care provider if you should:  Be screened for bone loss.  Take a calcium or vitamin D supplement to lower your risk of fractures.  Be given hormone replacement therapy (HRT) to treat symptoms of menopause. Follow these instructions at home: Lifestyle  Do not use any products that contain nicotine or tobacco, such as cigarettes, e-cigarettes, and chewing tobacco. If you need help quitting, ask your health care provider.  Do not use street drugs.  Do not share needles.  Ask your health care provider for help if you need support or information about quitting drugs. Alcohol use  Do not drink alcohol if: ? Your health care provider tells you not to drink. ? You are pregnant, may be pregnant, or are planning to become pregnant.  If you drink alcohol: ? Limit how much you use to 0-1 drink a day. ? Limit intake if you are breastfeeding.  Be aware of how much alcohol is in your drink. In the [place] one drink equals one 12 oz bottle of beer (355 mL), one 5 oz glass of wine (148 mL), or one 1 oz glass of hard liquor (44 mL). General instructions  Schedule regular health, dental, and eye exams.  Stay current with your vaccines.  Tell your health care provider if: ? You often feel depressed. ? You have ever been abused or do not feel safe  at home. Summary  Adopting a healthy lifestyle and getting preventive care are important in promoting health and wellness.  Follow your health care provider's instructions about healthy diet, exercising, and getting tested or screened for diseases.  Follow your health care provider's instructions on monitoring your cholesterol and blood pressure. This information is not intended to replace advice given to you by your health care provider. Make sure you discuss any questions you have with your health care provider. Document Revised: 03/30/2018 Document Reviewed: 03/30/2018 Elsevier Patient Education  2020 [employer].

## 2019-05-03 NOTE — Progress Notes (Addendum)
[place] at [facility] [address], [place], [place] [address] [phone]  Date:  05/04/2019   Name:  Shannon West   DOB:  03/14/1956   MRN:  [id number]  PCP:  Darreld Mclean, MD    Chief Complaint: Annual Exam   History of Present Illness:  Shannon West is a 64 y.o. very pleasant female patient who presents with the following:  Here today for annual physical History of prediabetes, hyperlipidemia, osteopenia, factor V Leiden carrier/microcytic anemia  Last seen by myself in late 2019 for Pap  Mammogram October 2019 Pap is up-to-date Colon cancer screening up-to-date Due for routine labs- will do today Bone density January 2018, can update if patient likes- will order for her today  Can start Shingrix- pt prefers to wait until she is on medicare   Her mother is 48 yo and is suffering from dementia-. Otherwise [name] is doing well, has no complaints Lab Results  Component Value Date   HGBA1C 5.9 05/26/2017   Wt Readings from Last 3 Encounters:  05/04/19 126 lb (57.2 kg)  02/16/18 128 lb (58.1 kg)  06/03/17 126 lb (57.2 kg)    Patient Active Problem List   Diagnosis Date Noted  . Pre-diabetes 06/03/2017  . 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 Transaminitis 07/13/2011  . Factor 5 Leiden mutation, heterozygous ([place]) 06/23/2011  . VITAMIN D DEFICIENCY 03/24/2010  . HYPERLIPIDEMIA 03/03/2007  . GERD 09/07/2006  . Osteopenia 09/07/2006    Past Medical History:  Diagnosis Date  . Clotting disorder ([place])   . Factor 5 Leiden mutation, heterozygous [facility])    Dr Beryle Beams, Hematology  . GERD (gastroesophageal reflux disease)   . History of anemia   . Hyperlipemia   . Hypochromic-microcytic anemia 07/13/2011   Hb 10.9 MCV 83.5   . Osteopenia    BMD @ [facility]  . Transaminitis 07/13/2011   Borderline  SGPT 38  06/18/11 normal SGOT  . Vitamin D deficiency 2008    Past Surgical History:   Procedure Laterality Date  . CARPAL TUNNEL RELEASE Right    RUE  . COLONOSCOPY  2007   Negative; Dr Carlean Purl  . UPPER GASTROINTESTINAL ENDOSCOPY  2007   hiatal hernia    Social History   Tobacco Use  . Smoking status: Never Smoker  . Smokeless tobacco: Never Used  Substance Use Topics  . Alcohol use: No    Alcohol/week: 0.0 standard drinks    Comment: rarely  . Drug use: No    Family History  Problem Relation Age of Onset  . Hyperlipidemia Father   . Aortic aneurysm Father        smoker  . Osteoporosis Mother   . Hyperlipidemia Mother   . Hypothyroidism Mother   . CVA Mother 18  . Diabetes Brother        died in sleep in context of pulmonary infection @ 70  . Hyperlipidemia Brother   . Clotting disorder Brother        factor 5 deficiency with DVT  . Diabetes Maternal Grandmother   . Heart attack Neg Hx     No Known Allergies  Medication list has been reviewed and updated.  Current Outpatient Medications on File Prior to Visit  Medication Sig Dispense Refill  . aspirin EC 81 MG tablet Take 81 mg by mouth daily.    . calcium carbonate (OS-CAL) 600 MG TABS Take 600 mg by mouth daily.     Marland Kitchen  Omega-3 Fatty Acids (FISH OIL) 1000 MG CAPS Take 1,000 mg by mouth daily.     . pravastatin (PRAVACHOL) 20 MG tablet Take 1 tablet by mouth once daily 90 tablet 1   No current facility-administered medications on file prior to visit.    Review of Systems:  As per HPI- otherwise negative. She is trying to exercise  Physical Examination: Vitals:   05/04/19 1404  BP: 112/64  Pulse: 74  Resp: 16  Temp: (!) 97.5 F (36.4 C)  SpO2: 94%   Vitals:   05/04/19 1404  Weight: 126 lb (57.2 kg)  Height: 5\' 2"  (1.575 m)   Body mass index is 23.05 kg/m. Ideal Body Weight: Weight in (lb) to have BMI = 25: 136.4  GEN: WDWN, NAD, Non-toxic, A & O x 3, normal weight, looks well HEENT: Atraumatic, Normocephalic. Neck supple. No masses, No LAD. Ears and Nose: No external  deformity. CV: RRR, No M/G/R. No JVD. No thrill. No extra heart sounds. PULM: CTA B, no wheezes, crackles, rhonchi. No retractions. No resp. distress. No accessory muscle use. ABD: S, NT, ND, +BS. No rebound. No HSM. EXTR: No c/c/e NEURO Normal gait.  PSYCH: Normally interactive. Conversant. Not depressed or anxious appearing.  Calm demeanor.    Assessment and Plan: Physical exam  Pre-diabetes - Plan: Comprehensive metabolic panel, Hemoglobin A1c  Encounter for screening mammogram for malignant neoplasm of breast  HYPERLIPIDEMIA - Plan: Lipid panel, pravastatin (PRAVACHOL) 20 MG tablet  Screening for deficiency anemia - Plan: CBC  Screening for thyroid disorder - Plan: TSH  Screening for HIV (human immunodeficiency virus) - Plan: HIV antibody  Osteopenia, unspecified location - Plan: DG Bone Density  Only healthy woman here today for complete physical Refill medications, labs pending as above Ordered bone density scan She plans to have her shingles vaccine done after her Medicare coverage begins  This visit occurred during the SARS-CoV-2 public health emergency.  Safety protocols were in place, including screening questions prior to the visit, additional usage of staff PPE, and extensive cleaning of exam room while observing appropriate contact time as indicated for disinfecting solutions.     Signed Lamar Blinks, MD  Received her labs, message to pt  Results for orders placed or performed in visit on 05/04/19  CBC  Result Value Ref Range   WBC 7.8 4.0 - 10.5 K/uL   RBC 4.60 3.87 - 5.11 Mil/uL   Platelets 239.0 150.0 - 400.0 K/uL   Hemoglobin 13.8 12.0 - 15.0 g/dL   HCT 41.5 36.0 - 46.0 %   MCV 90.2 78.0 - 100.0 fl   MCHC 33.2 30.0 - 36.0 g/dL   RDW 13.7 11.5 - 15.5 %  Comprehensive metabolic panel  Result Value Ref Range   Sodium 137 135 - 145 mEq/L   Potassium 4.7 3.5 - 5.1 mEq/L   Chloride 100 96 - 112 mEq/L   CO2 28 19 - 32 mEq/L   Glucose, Bld 75 70 -  99 mg/dL   BUN 17 6 - 23 mg/dL   Creatinine, Ser 0.78 0.40 - 1.20 mg/dL   Total Bilirubin 0.6 0.2 - 1.2 mg/dL   Alkaline Phosphatase 54 39 - 117 U/L   AST 21 0 - 37 U/L   ALT 18 0 - 35 U/L   Total Protein 7.3 6.0 - 8.3 g/dL   Albumin 4.5 3.5 - 5.2 g/dL   GFR 74.25 >60.00 mL/min   Calcium 9.9 8.4 - 10.5 mg/dL  Hemoglobin A1c  Result Value  Ref Range   Hgb A1c MFr Bld 5.7 4.6 - 6.5 %  Lipid panel  Result Value Ref Range   Cholesterol 163 0 - 200 mg/dL   Triglycerides 100.0 0.0 - 149.0 mg/dL   HDL 48.20 >39.00 mg/dL   VLDL 20.0 0.0 - 40.0 mg/dL   LDL Cholesterol 95 0 - 99 mg/dL   Total CHOL/HDL Ratio 3    NonHDL 114.87   HIV antibody  Result Value Ref Range   HIV 1&2 Ab, 4th Generation NON-REACTIVE NON-REACTI  TSH  Result Value Ref Range   TSH 2.73 0.35 - 4.50 uIU/mL

## 2019-05-04 ENCOUNTER — Other Ambulatory Visit: Payer: Self-pay

## 2019-05-04 ENCOUNTER — Encounter: Payer: Self-pay | Admitting: Family Medicine

## 2019-05-04 ENCOUNTER — Ambulatory Visit (INDEPENDENT_AMBULATORY_CARE_PROVIDER_SITE_OTHER): Payer: No Typology Code available for payment source | Admitting: Family Medicine

## 2019-05-04 VITALS — BP 112/64 | HR 74 | Temp 97.5°F | Resp 16 | Ht 62.0 in | Wt 126.0 lb

## 2019-05-04 DIAGNOSIS — R7303 Prediabetes: Secondary | ICD-10-CM

## 2019-05-04 DIAGNOSIS — Z0000 Encounter for general adult medical examination without abnormal findings: Secondary | ICD-10-CM | POA: Diagnosis not present

## 2019-05-04 DIAGNOSIS — Z1329 Encounter for screening for other suspected endocrine disorder: Secondary | ICD-10-CM

## 2019-05-04 DIAGNOSIS — Z1231 Encounter for screening mammogram for malignant neoplasm of breast: Secondary | ICD-10-CM

## 2019-05-04 DIAGNOSIS — Z114 Encounter for screening for human immunodeficiency virus [HIV]: Secondary | ICD-10-CM

## 2019-05-04 DIAGNOSIS — E782 Mixed hyperlipidemia: Secondary | ICD-10-CM | POA: Diagnosis not present

## 2019-05-04 DIAGNOSIS — Z130 Encounter for screening for diseases of the blood and blood-forming organs and certain disorders involving the immune mechanism: Secondary | ICD-10-CM | POA: Diagnosis not present

## 2019-05-04 DIAGNOSIS — M8580 Other specified disorders of bone density and structure, unspecified site: Secondary | ICD-10-CM

## 2019-05-04 MED ORDER — PRAVASTATIN SODIUM 20 MG PO TABS: 20.0000 mg | ORAL_TABLET | Freq: Every day | ORAL | 3 refills | Status: DC

## 2019-05-05 ENCOUNTER — Encounter: Payer: Self-pay | Admitting: Family Medicine

## 2019-05-05 LAB — LIPID PANEL
Cholesterol: 163 mg/dL (ref 0–200)
HDL: 48.2 mg/dL (ref >39.00)
LDL Cholesterol: 95 mg/dL (ref 0–99)
NonHDL: 114.87
Total CHOL/HDL Ratio: 3
Triglycerides: 100 mg/dL (ref 0.0–149.0)
VLDL: 20 mg/dL (ref 0.0–40.0)

## 2019-05-05 LAB — CBC
HCT: 41.5 % (ref 36.0–46.0)
Hemoglobin: 13.8 g/dL (ref 12.0–15.0)
MCHC: 33.2 g/dL (ref 30.0–36.0)
MCV: 90.2 fl (ref 78.0–100.0)
Platelets: 239 10*3/uL (ref 150.0–400.0)
RBC: 4.6 Mil/uL (ref 3.87–5.11)
RDW: 13.7 % (ref 11.5–15.5)
WBC: 7.8 10*3/uL (ref 4.0–10.5)

## 2019-05-05 LAB — COMPREHENSIVE METABOLIC PANEL
ALT: 18 U/L (ref 0–35)
AST: 21 U/L (ref 0–37)
Albumin: 4.5 g/dL (ref 3.5–5.2)
Alkaline Phosphatase: 54 U/L (ref 39–117)
BUN: 17 mg/dL (ref 6–23)
CO2: 28 mEq/L (ref 19–32)
Calcium: 9.9 mg/dL (ref 8.4–10.5)
Chloride: 100 mEq/L (ref 96–112)
Creatinine, Ser: 0.78 mg/dL (ref 0.40–1.20)
GFR: 74.25 mL/min (ref >60.00)
Glucose, Bld: 75 mg/dL (ref 70–99)
Potassium: 4.7 mEq/L (ref 3.5–5.1)
Sodium: 137 mEq/L (ref 135–145)
Total Bilirubin: 0.6 mg/dL (ref 0.2–1.2)
Total Protein: 7.3 g/dL (ref 6.0–8.3)

## 2019-05-05 LAB — TSH: TSH: 2.73 u[IU]/mL (ref 0.35–4.50)

## 2019-05-05 LAB — HEMOGLOBIN A1C: Hgb A1c MFr Bld: 5.7 % (ref 4.6–6.5)

## 2019-05-05 LAB — HIV ANTIBODY (ROUTINE TESTING W REFLEX): HIV 1&2 Ab, 4th Generation: NONREACTIVE

## 2019-05-10 ENCOUNTER — Other Ambulatory Visit: Payer: Self-pay | Admitting: Family Medicine

## 2019-05-10 DIAGNOSIS — Z1231 Encounter for screening mammogram for malignant neoplasm of breast: Secondary | ICD-10-CM

## 2019-05-24 ENCOUNTER — Encounter: Payer: Self-pay | Admitting: Family Medicine

## 2019-05-24 ENCOUNTER — Ambulatory Visit (INDEPENDENT_AMBULATORY_CARE_PROVIDER_SITE_OTHER): Payer: No Typology Code available for payment source

## 2019-05-24 ENCOUNTER — Other Ambulatory Visit: Payer: Self-pay

## 2019-05-24 DIAGNOSIS — M8580 Other specified disorders of bone density and structure, unspecified site: Secondary | ICD-10-CM | POA: Diagnosis not present

## 2019-05-24 DIAGNOSIS — Z1231 Encounter for screening mammogram for malignant neoplasm of breast: Secondary | ICD-10-CM | POA: Diagnosis not present

## 2019-05-25 ENCOUNTER — Other Ambulatory Visit: Payer: Self-pay | Admitting: Family Medicine

## 2019-05-25 DIAGNOSIS — M810 Age-related osteoporosis without current pathological fracture: Secondary | ICD-10-CM

## 2019-05-25 MED ORDER — ALENDRONATE SODIUM 70 MG PO TABS: 70.0000 mg | ORAL_TABLET | ORAL | 3 refills | Status: DC

## 2019-07-20 ENCOUNTER — Ambulatory Visit: Payer: No Typology Code available for payment source

## 2019-08-03 ENCOUNTER — Ambulatory Visit: Payer: No Typology Code available for payment source

## 2019-08-03 ENCOUNTER — Ambulatory Visit: Payer: No Typology Code available for payment source | Attending: Internal Medicine

## 2019-08-03 NOTE — Progress Notes (Signed)
   Covid-19 Vaccination Clinic  Name:  Shannon West    MRN: [id number] DOB: June 08, 1955  08/03/2019  [name] was observed post Covid-19 immunization for 15 minutes without incident. She was provided with Vaccine Information Sheet and instruction to access the V-Safe system.   [name] was instructed to call 911 with any severe reactions post vaccine: Marland Kitchen Difficulty breathing  . Swelling of face and throat  . A fast heartbeat  . A bad rash all over body  . Dizziness and weakness

## 2019-08-16 ENCOUNTER — Ambulatory Visit (INDEPENDENT_AMBULATORY_CARE_PROVIDER_SITE_OTHER): Payer: No Typology Code available for payment source | Admitting: Family Medicine

## 2019-08-16 ENCOUNTER — Other Ambulatory Visit: Payer: Self-pay

## 2019-08-16 ENCOUNTER — Encounter: Payer: Self-pay | Admitting: Family Medicine

## 2019-08-16 VITALS — BP 112/68 | HR 74 | Resp 16 | Ht 62.0 in | Wt 125.0 lb

## 2019-08-16 DIAGNOSIS — L03011 Cellulitis of right finger: Secondary | ICD-10-CM

## 2019-08-16 MED ORDER — CEPHALEXIN 500 MG PO CAPS: 500.0000 mg | ORAL_CAPSULE | Freq: Three times a day (TID) | ORAL | 0 refills | Status: DC

## 2019-08-16 NOTE — Patient Instructions (Signed)
Please let me know if your finger is not improving in about 24 hours- Sooner if worse.  We will treat your infection with keflex three times a day for a week

## 2019-08-16 NOTE — Progress Notes (Signed)
[place] at [facility] [address] [place], [place] [address] [phone]  Date:  08/16/2019   Name:  Shannon West   DOB:  13-Oct-1955   MRN:  [id number]  PCP:  Darreld Mclean, MD    Chief Complaint: Finger Infection (right finger pain after manicure friday, redness, swelling)   History of Present Illness:  Shannon West is a 64 y.o. very pleasant female patient who presents with the following:  Pt with history of hyperlipidemia, factor V Leiden carrier/ anemia Here today with concern of possible infection of her fingertip following manicure   She was feeling fine, went for a manicure this past Friday.  Today is Wednesday.  About a day after her manicure she noted redness and tenderness at the radial aspect of the right middle fingernail. She returned to the [employer] shop yesterday and they did some manipulation, and got "a drop of pus out" The area is still tender, mostly just if she hits it or presses on  She otherwise feels fine, no fever, chills, systemic symptoms  Patient Active Problem List   Diagnosis Date Noted  . Pre-diabetes 06/03/2017  . 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 Transaminitis 07/13/2011  . Factor 5 Leiden mutation, heterozygous ([place]) 06/23/2011  . VITAMIN D DEFICIENCY 03/24/2010  . HYPERLIPIDEMIA 03/03/2007  . GERD 09/07/2006  . Osteoporosis 09/07/2006    Past Medical History:  Diagnosis Date  . Clotting disorder ([place])   . Factor 5 Leiden mutation, heterozygous [facility])    Dr Beryle Beams, Hematology  . GERD (gastroesophageal reflux disease)   . History of anemia   . Hyperlipemia   . Hypochromic-microcytic anemia 07/13/2011   Hb 10.9 MCV 83.5   . Osteopenia    BMD @ [facility]  . Transaminitis 07/13/2011   Borderline  SGPT 38  06/18/11 normal SGOT  . Vitamin D deficiency 2008    Past Surgical History:  Procedure Laterality Date  . CARPAL TUNNEL RELEASE Right    RUE  .  COLONOSCOPY  2007   Negative; Dr Carlean Purl  . UPPER GASTROINTESTINAL ENDOSCOPY  2007   hiatal hernia    Social History   Tobacco Use  . Smoking status: Never Smoker  . Smokeless tobacco: Never Used  Substance Use Topics  . Alcohol use: No    Alcohol/week: 0.0 standard drinks    Comment: rarely  . Drug use: No    Family History  Problem Relation Age of Onset  . Hyperlipidemia Father   . Aortic aneurysm Father        smoker  . Osteoporosis Mother   . Hyperlipidemia Mother   . Hypothyroidism Mother   . CVA Mother 19  . Diabetes Brother        died in sleep in context of pulmonary infection @ 47  . Hyperlipidemia Brother   . Clotting disorder Brother        factor 5 deficiency with DVT  . Diabetes Maternal Grandmother   . Heart attack Neg Hx     No Known Allergies  Medication list has been reviewed and updated.  Current Outpatient Medications on File Prior to Visit  Medication Sig Dispense Refill  . alendronate (FOSAMAX) 70 MG tablet Take 1 tablet (70 mg total) by mouth every 7 (seven) days. Take with a full glass of water on an empty stomach. 12 tablet 3  . aspirin EC 81 MG tablet Take 81 mg by mouth daily.    Marland Kitchen  calcium carbonate (OS-CAL) 600 MG TABS Take 600 mg by mouth daily.     . Omega-3 Fatty Acids (FISH OIL) 1000 MG CAPS Take 1,000 mg by mouth daily.     . pravastatin (PRAVACHOL) 20 MG tablet Take 1 tablet (20 mg total) by mouth daily. 90 tablet 3   No current facility-administered medications on file prior to visit.    Review of Systems:  As per HPI- otherwise negative.   Physical Examination: Vitals:   08/16/19 1448  BP: 112/68  Pulse: 74  Resp: 16  SpO2: 96%   Vitals:   08/16/19 1448  Weight: 125 lb (56.7 kg)  Height: 5\' 2"  (1.575 m)   Body mass index is 22.86 kg/m. Ideal Body Weight: Weight in (lb) to have BMI = 25: 136.4  GEN: no acute distress.  Normal weight, looks well HEENT: Atraumatic, Normocephalic.  Ears and Nose: No external  deformity. CV: RRR, No M/G/R. No JVD. No thrill. No extra heart sounds. PULM: CTA B, no wheezes, crackles, rhonchi. No retractions. No resp. distress. No accessory muscle use. EXTR: No c/c/e PSYCH: Normally interactive. Conversant.  There is mild redness and inflammation about the right long finger nailbed, see photograph below    No pus or fluctuance is noted at this time  Assessment and Plan: Paronychia of finger of right hand - Plan: cephALEXin (KEFLEX) 500 MG capsule  Mild paronychia following recent manicure.  We will start patient on Keflex for 1 week, she is asked to alert me if not improving in about 24 hours-sooner if getting worse  This visit occurred during the SARS-CoV-2 public health emergency.  Safety protocols were in place, including screening questions prior to the visit, additional usage of staff PPE, and extensive cleaning of exam room while observing appropriate contact time as indicated for disinfecting solutions.     Signed Lamar Blinks, MD

## 2019-08-23 ENCOUNTER — Ambulatory Visit: Payer: No Typology Code available for payment source | Attending: Internal Medicine

## 2019-08-23 DIAGNOSIS — Z23 Encounter for immunization: Secondary | ICD-10-CM

## 2019-08-23 NOTE — Progress Notes (Signed)
   Covid-19 Vaccination Clinic  Name:  Shannon West    MRN: [id number] DOB: 05/12/1955  08/23/2019  Shannon West was observed post Covid-19 immunization for 15 minutes without incident. She was provided with Vaccine Information Sheet and instruction to access the V-Safe system.   Shannon West was instructed to call 911 with any severe reactions post vaccine: Marland Kitchen Difficulty breathing  . Swelling of face and throat  . A fast heartbeat  . A bad rash all over body  . Dizziness and weakness   Immunizations Administered    Name Date Dose VIS Date Route   Pfizer COVID-19 Vaccine 08/23/2019 10:49 AM 0.3 mL 06/14/2018 Intramuscular   Manufacturer: [employer]   Lot: [id number]   [place]: [id number]

## 2019-12-14 ENCOUNTER — Encounter: Payer: Self-pay | Admitting: Family Medicine

## 2019-12-14 DIAGNOSIS — E782 Mixed hyperlipidemia: Secondary | ICD-10-CM

## 2019-12-14 DIAGNOSIS — M810 Age-related osteoporosis without current pathological fracture: Secondary | ICD-10-CM

## 2019-12-15 MED ORDER — ALENDRONATE SODIUM 70 MG PO TABS: 70.0000 mg | ORAL_TABLET | ORAL | 2 refills | Status: DC

## 2019-12-15 MED ORDER — PRAVASTATIN SODIUM 20 MG PO TABS: 20.0000 mg | ORAL_TABLET | Freq: Every day | ORAL | 1 refills | Status: DC

## 2019-12-28 MED ORDER — PRAVASTATIN SODIUM 20 MG PO TABS: 20.0000 mg | ORAL_TABLET | Freq: Every day | ORAL | 1 refills | Status: DC

## 2019-12-28 NOTE — Addendum Note (Signed)
Addended byDamita Dunnings D on: 12/28/2019 01:04 PM   Modules accepted: Orders

## 2020-05-02 ENCOUNTER — Other Ambulatory Visit: Payer: Self-pay | Admitting: Family Medicine

## 2020-05-02 DIAGNOSIS — E782 Mixed hyperlipidemia: Secondary | ICD-10-CM

## 2020-07-10 ENCOUNTER — Other Ambulatory Visit: Payer: Self-pay | Admitting: Family Medicine

## 2020-07-10 DIAGNOSIS — M810 Age-related osteoporosis without current pathological fracture: Secondary | ICD-10-CM

## 2020-08-21 ENCOUNTER — Encounter: Payer: Self-pay | Admitting: Family Medicine

## 2020-08-21 DIAGNOSIS — E782 Mixed hyperlipidemia: Secondary | ICD-10-CM

## 2020-08-21 DIAGNOSIS — M810 Age-related osteoporosis without current pathological fracture: Secondary | ICD-10-CM

## 2020-08-21 MED ORDER — PRAVASTATIN SODIUM 20 MG PO TABS: 20.0000 mg | ORAL_TABLET | Freq: Every day | ORAL | 3 refills | Status: DC

## 2020-08-21 MED ORDER — ALENDRONATE SODIUM 70 MG PO TABS: ORAL_TABLET | ORAL | 1 refills | Status: DC

## 2020-08-31 NOTE — Progress Notes (Deleted)
[place] at [facility] [address], [place], [place] [address] [phone]  Date:  09/04/2020   Name:  BRIYAH WHEELWRIGHT   DOB:  20-Apr-1956   MRN:  [id number]  PCP:  Darreld Mclean, MD    Chief Complaint: No chief complaint on file.   History of Present Illness:  LAILEE HOELZEL is a 65 y.o. very pleasant female patient who presents with the following:  Here today for physical exam Last seen by myself last month for paronychia  History of prediabetes, hyperlipidemia, osteopenia, factor V Leiden carrier/microcytic anemia COVID-19 booster Shingrix Can give pneumonia vaccine Pap is up-to-date Mammogram last year DEXA last year Colonoscopy 2018 Most recent labs January 2021   Fosamax Pravastatin Baby aspirin  Patient Active Problem List   Diagnosis Date Noted  . Pre-diabetes 06/03/2017  . 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 Transaminitis 07/13/2011  . Factor 5 Leiden mutation, heterozygous ([place]) 06/23/2011  . VITAMIN D DEFICIENCY 03/24/2010  . HYPERLIPIDEMIA 03/03/2007  . GERD 09/07/2006  . Osteoporosis 09/07/2006    Past Medical History:  Diagnosis Date  . Clotting disorder ([place])   . Factor 5 Leiden mutation, heterozygous [facility])    Dr Beryle Beams, Hematology  . GERD (gastroesophageal reflux disease)   . History of anemia   . Hyperlipemia   . Hypochromic-microcytic anemia 07/13/2011   Hb 10.9 MCV 83.5   . Osteopenia    BMD @ [facility]  . Transaminitis 07/13/2011   Borderline  SGPT 38  06/18/11 normal SGOT  . Vitamin D deficiency 2008    Past Surgical History:  Procedure Laterality Date  . CARPAL TUNNEL RELEASE Right    RUE  . COLONOSCOPY  2007   Negative; Dr Carlean Purl  . UPPER GASTROINTESTINAL ENDOSCOPY  2007   hiatal hernia    Social History   Tobacco Use  . Smoking status: Never Smoker  . Smokeless tobacco: Never Used  Substance Use Topics  . Alcohol use: No    Alcohol/week: 0.0  standard drinks    Comment: rarely  . Drug use: No    Family History  Problem Relation Age of Onset  . Hyperlipidemia Father   . Aortic aneurysm Father        smoker  . Osteoporosis Mother   . Hyperlipidemia Mother   . Hypothyroidism Mother   . CVA Mother 32  . Diabetes Brother        died in sleep in context of pulmonary infection @ 13  . Hyperlipidemia Brother   . Clotting disorder Brother        factor 5 deficiency with DVT  . Diabetes Maternal Grandmother   . Heart attack Neg Hx     No Known Allergies  Medication list has been reviewed and updated.  Current Outpatient Medications on File Prior to Visit  Medication Sig Dispense Refill  . alendronate (FOSAMAX) 70 MG tablet TAKE 1 TABLET BY MOUTH  WEEKLY WITH 8 OZ OF PLAIN  WATER 30 MINUTES BEFORE  FIRST FOOD, DRINK OR MEDS.  STAY UPRIGHT FOR 30 MINS 12 tablet 1  . aspirin EC 81 MG tablet Take 81 mg by mouth daily.    . calcium carbonate (OS-CAL) 600 MG TABS Take 600 mg by mouth daily.     . cephALEXin (KEFLEX) 500 MG capsule Take 1 capsule (500 mg total) by mouth 3 (three) times daily. 21 capsule 0  . Omega-3 Fatty Acids (FISH OIL) 1000 MG CAPS  Take 1,000 mg by mouth daily.     . pravastatin (PRAVACHOL) 20 MG tablet Take 1 tablet (20 mg total) by mouth daily. 90 tablet 3   No current facility-administered medications on file prior to visit.    Review of Systems:  As per HPI- otherwise negative.   Physical Examination: There were no vitals filed for this visit. There were no vitals filed for this visit. There is no height or weight on file to calculate BMI. Ideal Body Weight:    GEN: no acute distress. HEENT: Atraumatic, Normocephalic.  Ears and Nose: No external deformity. CV: RRR, No M/G/R. No JVD. No thrill. No extra heart sounds. PULM: CTA B, no wheezes, crackles, rhonchi. No retractions. No resp. distress. No accessory muscle use. ABD: S, NT, ND, +BS. No rebound. No HSM. EXTR: No c/c/e PSYCH: Normally  interactive. Conversant.    Assessment and Plan: ***  Physical exam today Encouraged healthy diet and exercise routine Will plan further follow- up pending labs.  Signed Lamar Blinks, MD

## 2020-09-04 ENCOUNTER — Ambulatory Visit (INDEPENDENT_AMBULATORY_CARE_PROVIDER_SITE_OTHER): Payer: No Typology Code available for payment source | Admitting: Family Medicine

## 2020-09-04 DIAGNOSIS — R7303 Prediabetes: Secondary | ICD-10-CM

## 2020-09-04 DIAGNOSIS — Z0000 Encounter for general adult medical examination without abnormal findings: Secondary | ICD-10-CM

## 2020-09-04 DIAGNOSIS — Z538 Procedure and treatment not carried out for other reasons: Secondary | ICD-10-CM

## 2020-09-04 DIAGNOSIS — Z130 Encounter for screening for diseases of the blood and blood-forming organs and certain disorders involving the immune mechanism: Secondary | ICD-10-CM

## 2020-09-04 DIAGNOSIS — Z1329 Encounter for screening for other suspected endocrine disorder: Secondary | ICD-10-CM

## 2020-09-04 DIAGNOSIS — R5383 Other fatigue: Secondary | ICD-10-CM

## 2020-09-04 DIAGNOSIS — M810 Age-related osteoporosis without current pathological fracture: Secondary | ICD-10-CM

## 2020-09-04 DIAGNOSIS — E782 Mixed hyperlipidemia: Secondary | ICD-10-CM

## 2020-09-17 ENCOUNTER — Other Ambulatory Visit (HOSPITAL_BASED_OUTPATIENT_CLINIC_OR_DEPARTMENT_OTHER): Payer: Self-pay | Admitting: Family Medicine

## 2020-09-17 DIAGNOSIS — Z1231 Encounter for screening mammogram for malignant neoplasm of breast: Secondary | ICD-10-CM

## 2020-09-19 ENCOUNTER — Other Ambulatory Visit: Payer: Self-pay

## 2020-09-19 ENCOUNTER — Ambulatory Visit (INDEPENDENT_AMBULATORY_CARE_PROVIDER_SITE_OTHER): Payer: Medicare (Managed Care)

## 2020-09-19 DIAGNOSIS — Z1231 Encounter for screening mammogram for malignant neoplasm of breast: Secondary | ICD-10-CM | POA: Diagnosis not present

## 2020-09-24 ENCOUNTER — Other Ambulatory Visit: Payer: Self-pay | Admitting: Family Medicine

## 2020-09-24 DIAGNOSIS — R928 Other abnormal and inconclusive findings on diagnostic imaging of breast: Secondary | ICD-10-CM

## 2020-09-25 ENCOUNTER — Encounter: Payer: No Typology Code available for payment source | Admitting: Family Medicine

## 2020-10-16 ENCOUNTER — Ambulatory Visit
Admission: RE | Admit: 2020-10-16 | Discharge: 2020-10-16 | Disposition: A | Discharge status: Home / Self Care | Payer: Medicare (Managed Care) | Source: Ambulatory Visit | Attending: Family Medicine | Admitting: Family Medicine

## 2020-10-16 ENCOUNTER — Ambulatory Visit: Payer: Medicare (Managed Care)

## 2020-10-16 ENCOUNTER — Other Ambulatory Visit: Payer: Self-pay

## 2020-10-16 DIAGNOSIS — R922 Inconclusive mammogram: Secondary | ICD-10-CM | POA: Diagnosis not present

## 2020-10-16 DIAGNOSIS — R928 Other abnormal and inconclusive findings on diagnostic imaging of breast: Secondary | ICD-10-CM

## 2020-10-22 NOTE — Progress Notes (Addendum)
[place] at [facility] [address], [place], [place] [address] [phone]  Date:  10/30/2020   Name:  Shannon West   DOB:  [birth year]   MRN:  [id number]  PCP:  Darreld Mclean, MD    Chief Complaint: No chief complaint on file.   History of Present Illness:  Shannon West is a 65 y.o. very pleasant female patient who presents with the following:  Pt seen today for a physical exam Last visit with myself was in April for a paronychia, last CPE 1/21 History of prediabetes, hyperlipidemia, osteoporosis, factor V Leiden carrier/microcytic anemia  Her mother is 74 yo and suffers from dementia- her brother is now helping out with her which is helpful   Shingrix- did one dose so far  Pneumonia now due - age. Will give prevnar 20 today  Covid booster Pap can be updated if she likes- will update today, no history of abnormal Pap Colon due next year  Mammo UTD Labs one year ago   Taking fosamax for osteoporosis. Dexa 2/21 Pravastatin  No CP or SOB No PMB  She takes care of her 43-year-old granddaughter a couple of mornings a week She also has an 93-year-old granddaughter  She does not get a lot of formal exercise, but stays very active in general Lab Results  Component Value Date   HGBA1C 5.7 05/04/2019    Patient Active Problem List   Diagnosis Date Noted   Pre-diabetes 06/03/2017   Hypochromic-microcytic anemia 07/13/2011   Transaminitis 07/13/2011   Factor 5 Leiden mutation, heterozygous ([place]) 06/23/2011   VITAMIN D DEFICIENCY 03/24/2010   HYPERLIPIDEMIA 03/03/2007   GERD 09/07/2006   Osteoporosis 09/07/2006    Past Medical History:  Diagnosis Date   Clotting disorder ([place])    Factor 5 Leiden mutation, heterozygous ([place])    Dr Beryle Beams, Hematology   GERD (gastroesophageal reflux disease)    History of anemia    Hyperlipemia    Hypochromic-microcytic anemia 07/13/2011   Hb 10.9 MCV 83.5    Osteopenia     BMD @ [facility]   Osteoporosis    Transaminitis 07/13/2011   Borderline  SGPT 38  06/18/11 normal SGOT   Vitamin D deficiency 2008    Past Surgical History:  Procedure Laterality Date   CARPAL TUNNEL RELEASE Right    RUE   COLONOSCOPY  2007   Negative; Dr Carlean Purl   UPPER GASTROINTESTINAL ENDOSCOPY  2007   hiatal hernia    Social History   Tobacco Use   Smoking status: Never   Smokeless tobacco: Never  Substance Use Topics   Alcohol use: No    Alcohol/week: 0.0 standard drinks    Comment: rarely   Drug use: No    Family History  Problem Relation Age of Onset   Hyperlipidemia Father    Aortic aneurysm Father        smoker   Osteoporosis Mother    Hyperlipidemia Mother    Hypothyroidism Mother    CVA Mother 79   Diabetes Brother        died in sleep in context of pulmonary infection @ 34   Hyperlipidemia Brother    Clotting disorder Brother        factor 5 deficiency with DVT   Diabetes Maternal Grandmother    Heart attack Neg Hx     No Known Allergies  Medication list has been reviewed and updated.  Current Outpatient Medications  on File Prior to Visit  Medication Sig Dispense Refill   alendronate (FOSAMAX) 70 MG tablet TAKE 1 TABLET BY MOUTH  WEEKLY WITH 8 OZ OF PLAIN  WATER 30 MINUTES BEFORE  FIRST FOOD, DRINK OR MEDS.  STAY UPRIGHT FOR 30 MINS 12 tablet 1   aspirin EC 81 MG tablet Take 81 mg by mouth daily.     calcium carbonate (OS-CAL) 600 MG TABS Take 600 mg by mouth daily.      cephALEXin (KEFLEX) 500 MG capsule Take 1 capsule (500 mg total) by mouth 3 (three) times daily. 21 capsule 0   Omega-3 Fatty Acids (FISH OIL) 1000 MG CAPS Take 1,000 mg by mouth daily.      pravastatin (PRAVACHOL) 20 MG tablet Take 1 tablet (20 mg total) by mouth daily. 90 tablet 3   No current facility-administered medications on file prior to visit.    Review of Systems:  As per HPI- otherwise negative.   Physical Examination: Vitals:   10/30/20 0850  BP:  130/80  Pulse: 60   Vitals:   10/30/20 0850  Weight: 120 lb (54.4 kg)   Body mass index is 21.95 kg/m. Ideal Body Weight:    GEN: no acute distress.  Slim build, looks well HEENT: Atraumatic, Normocephalic.  Bilateral TM wnl, oropharynx normal.  PEERL,EOMI.  Ears and Nose: No external deformity. CV: RRR, No M/G/R. No JVD. No thrill. No extra heart sounds. PULM: CTA B, no wheezes, crackles, rhonchi. No retractions. No resp. distress. No accessory muscle use. ABD: S, NT, ND, +BS. No rebound. No HSM. EXTR: No c/c/e PSYCH: Normally interactive. Conversant.  Pap collected, normal pelvic exam for age  Assessment and Plan: Physical exam  HYPERLIPIDEMIA - Plan: Lipid panel  Pre-diabetes - Plan: Comprehensive metabolic panel, Hemoglobin A1c  Screening for deficiency anemia - Plan: CBC  Screening for thyroid disorder - Plan: TSH  Age-related osteoporosis without current pathological fracture - Plan: VITAMIN D 25 Hydroxy (Vit-D Deficiency, Fractures)  Screening for cervical cancer - Plan: Cytology - PAP  Immunization due - Plan: Pneumococcal conjugate vaccine 20-valent (Prevnar 20) Patient seen today for physical exam.  Encouraged continued healthy diet and exercise routine Pap and labs are pending Discussed health maintenance Prevnar 20 given  This visit occurred during the SARS-CoV-2 public health emergency.  Safety protocols were in place, including screening questions prior to the visit, additional usage of staff PPE, and extensive cleaning of exam room while observing appropriate contact time as indicated for disinfecting solutions.   Signed Lamar Blinks, MD  Received her labs as below, message to patient  Results for orders placed or performed in visit on 10/30/20  CBC  Result Value Ref Range   WBC 4.9 4.0 - 10.5 K/uL   RBC 4.42 3.87 - 5.11 Mil/uL   Platelets 249.0 150.0 - 400.0 K/uL   Hemoglobin 13.4 12.0 - 15.0 g/dL   HCT 39.6 36.0 - 46.0 %   MCV 89.5 78.0 -  100.0 fl   MCHC 33.8 30.0 - 36.0 g/dL   RDW 14.9 11.5 - 15.5 %  Comprehensive metabolic panel  Result Value Ref Range   Sodium 139 135 - 145 mEq/L   Potassium 4.1 3.5 - 5.1 mEq/L   Chloride 102 96 - 112 mEq/L   CO2 30 19 - 32 mEq/L   Glucose, Bld 85 70 - 99 mg/dL   BUN 13 6 - 23 mg/dL   Creatinine, Ser 0.74 0.40 - 1.20 mg/dL   Total Bilirubin 0.7 0.2 -  1.2 mg/dL   Alkaline Phosphatase 48 39 - 117 U/L   AST 21 0 - 37 U/L   ALT 16 0 - 35 U/L   Total Protein 7.1 6.0 - 8.3 g/dL   Albumin 4.4 3.5 - 5.2 g/dL   GFR 84.61 >60.00 mL/min   Calcium 9.8 8.4 - 10.5 mg/dL  Hemoglobin A1c  Result Value Ref Range   Hgb A1c MFr Bld 5.9 4.6 - 6.5 %  Lipid panel  Result Value Ref Range   Cholesterol 149 0 - 200 mg/dL   Triglycerides 75.0 0.0 - 149.0 mg/dL   HDL 46.10 >39.00 mg/dL   VLDL 15.0 0.0 - 40.0 mg/dL   LDL Cholesterol 88 0 - 99 mg/dL   Total CHOL/HDL Ratio 3    NonHDL 102.90   TSH  Result Value Ref Range   TSH 3.30 0.35 - 5.50 uIU/mL  VITAMIN D 25 Hydroxy (Vit-D Deficiency, Fractures)  Result Value Ref Range   VITD 39.52 30.00 - 100.00 ng/mL

## 2020-10-22 NOTE — Patient Instructions (Addendum)
It was great to see you again today, I will be in touch with your labs asap  Assuming all is well we can visit in one year Pneumonia vaccine given today Colon due next year

## 2020-10-30 ENCOUNTER — Other Ambulatory Visit (HOSPITAL_COMMUNITY)
Admission: RE | Admit: 2020-10-30 | Discharge: 2020-10-30 | Disposition: A | Discharge status: Home / Self Care | Payer: Medicare (Managed Care) | Source: Ambulatory Visit | Attending: Family Medicine | Admitting: Family Medicine

## 2020-10-30 ENCOUNTER — Ambulatory Visit (INDEPENDENT_AMBULATORY_CARE_PROVIDER_SITE_OTHER): Payer: Medicare (Managed Care) | Admitting: Family Medicine

## 2020-10-30 ENCOUNTER — Other Ambulatory Visit: Payer: Self-pay

## 2020-10-30 ENCOUNTER — Encounter: Payer: Self-pay | Admitting: Family Medicine

## 2020-10-30 VITALS — BP 130/80 | HR 60 | Wt 120.0 lb

## 2020-10-30 DIAGNOSIS — Z1329 Encounter for screening for other suspected endocrine disorder: Secondary | ICD-10-CM

## 2020-10-30 DIAGNOSIS — Z1151 Encounter for screening for human papillomavirus (HPV): Secondary | ICD-10-CM | POA: Diagnosis not present

## 2020-10-30 DIAGNOSIS — Z23 Encounter for immunization: Secondary | ICD-10-CM | POA: Diagnosis not present

## 2020-10-30 DIAGNOSIS — Z130 Encounter for screening for diseases of the blood and blood-forming organs and certain disorders involving the immune mechanism: Secondary | ICD-10-CM

## 2020-10-30 DIAGNOSIS — R7303 Prediabetes: Secondary | ICD-10-CM | POA: Diagnosis not present

## 2020-10-30 DIAGNOSIS — M810 Age-related osteoporosis without current pathological fracture: Secondary | ICD-10-CM | POA: Diagnosis not present

## 2020-10-30 DIAGNOSIS — Z124 Encounter for screening for malignant neoplasm of cervix: Secondary | ICD-10-CM | POA: Insufficient documentation

## 2020-10-30 DIAGNOSIS — E782 Mixed hyperlipidemia: Secondary | ICD-10-CM | POA: Diagnosis not present

## 2020-10-30 DIAGNOSIS — Z0000 Encounter for general adult medical examination without abnormal findings: Secondary | ICD-10-CM | POA: Diagnosis not present

## 2020-10-30 DIAGNOSIS — Z01419 Encounter for gynecological examination (general) (routine) without abnormal findings: Secondary | ICD-10-CM | POA: Insufficient documentation

## 2020-10-30 LAB — COMPREHENSIVE METABOLIC PANEL
ALT: 16 U/L (ref 0–35)
AST: 21 U/L (ref 0–37)
Albumin: 4.4 g/dL (ref 3.5–5.2)
Alkaline Phosphatase: 48 U/L (ref 39–117)
BUN: 13 mg/dL (ref 6–23)
CO2: 30 mEq/L (ref 19–32)
Calcium: 9.8 mg/dL (ref 8.4–10.5)
Chloride: 102 mEq/L (ref 96–112)
Creatinine, Ser: 0.74 mg/dL (ref 0.40–1.20)
GFR: 84.61 mL/min (ref >60.00)
Glucose, Bld: 85 mg/dL (ref 70–99)
Potassium: 4.1 mEq/L (ref 3.5–5.1)
Sodium: 139 mEq/L (ref 135–145)
Total Bilirubin: 0.7 mg/dL (ref 0.2–1.2)
Total Protein: 7.1 g/dL (ref 6.0–8.3)

## 2020-10-30 LAB — CBC
HCT: 39.6 % (ref 36.0–46.0)
Hemoglobin: 13.4 g/dL (ref 12.0–15.0)
MCHC: 33.8 g/dL (ref 30.0–36.0)
MCV: 89.5 fl (ref 78.0–100.0)
Platelets: 249 10*3/uL (ref 150.0–400.0)
RBC: 4.42 Mil/uL (ref 3.87–5.11)
RDW: 14.9 % (ref 11.5–15.5)
WBC: 4.9 10*3/uL (ref 4.0–10.5)

## 2020-10-30 LAB — LIPID PANEL
Cholesterol: 149 mg/dL (ref 0–200)
HDL: 46.1 mg/dL (ref >39.00)
LDL Cholesterol: 88 mg/dL (ref 0–99)
NonHDL: 102.9
Total CHOL/HDL Ratio: 3
Triglycerides: 75 mg/dL (ref 0.0–149.0)
VLDL: 15 mg/dL (ref 0.0–40.0)

## 2020-10-30 LAB — HEMOGLOBIN A1C: Hgb A1c MFr Bld: 5.9 % (ref 4.6–6.5)

## 2020-10-30 LAB — TSH: TSH: 3.3 u[IU]/mL (ref 0.35–5.50)

## 2020-10-30 LAB — VITAMIN D 25 HYDROXY (VIT D DEFICIENCY, FRACTURES): VITD: 39.52 ng/mL (ref 30.00–100.00)

## 2020-11-04 ENCOUNTER — Encounter: Payer: Self-pay | Admitting: Family Medicine

## 2020-11-04 LAB — CYTOLOGY - PAP
Comment: NEGATIVE
Diagnosis: NEGATIVE
High risk HPV: NEGATIVE

## 2020-11-20 ENCOUNTER — Encounter: Payer: Self-pay | Admitting: Family Medicine

## 2020-11-20 DIAGNOSIS — M810 Age-related osteoporosis without current pathological fracture: Secondary | ICD-10-CM

## 2020-11-20 MED ORDER — ALENDRONATE SODIUM 70 MG PO TABS: ORAL_TABLET | ORAL | 1 refills | Status: DC

## 2021-01-23 ENCOUNTER — Ambulatory Visit (INDEPENDENT_AMBULATORY_CARE_PROVIDER_SITE_OTHER): Payer: Medicare (Managed Care) | Admitting: Family Medicine

## 2021-01-23 ENCOUNTER — Other Ambulatory Visit: Payer: Self-pay

## 2021-01-23 ENCOUNTER — Encounter: Payer: Self-pay | Admitting: Family Medicine

## 2021-01-23 VITALS — BP 102/70 | HR 83 | Temp 98.1°F | Resp 18 | Ht 62.0 in | Wt 121.0 lb

## 2021-01-23 DIAGNOSIS — L309 Dermatitis, unspecified: Secondary | ICD-10-CM | POA: Diagnosis not present

## 2021-01-23 MED ORDER — PREDNISONE 10 MG PO TABS: ORAL_TABLET | ORAL | 0 refills | Status: DC

## 2021-01-23 MED ORDER — METHYLPREDNISOLONE ACETATE 80 MG/ML IJ SUSP
80.0000 mg | Freq: Once | INTRAMUSCULAR | Status: AC
  Administered 2021-01-23: 80 mg via INTRAMUSCULAR

## 2021-01-23 NOTE — Progress Notes (Signed)
Subjective:   By signing my name below, I, Shehryar Baig, attest that this documentation has been prepared under the direction and in the presence of Dr. Roma Schanz, DO. 01/23/2021     Patient ID: Shannon West, female    DOB: April 10, 1956, 65 y.o.   MRN: [id number]  Chief Complaint  Patient presents with   Rash    Neck and chest, x2 weeks, Pt states rash is itchy, no discharge.     Rash Pertinent negatives include no congestion, fever or shortness of breath.  Patient is in today for a office visit.   She complains of itchy rash on her chest. It later spread to to in between her breast and under her left breast for the past couple of weeks. She reports it started off as a itch on her chest and she would frequently itch it and it slowly developed on her breasts. She thinks it may be a reaction from some outside plant such as poison ivy.    Past Medical History:  Diagnosis Date   Clotting disorder ([place])    Factor 5 Leiden mutation, heterozygous ([place])    Dr Beryle Beams, Hematology   GERD (gastroesophageal reflux disease)    History of anemia    Hyperlipemia    Hypochromic-microcytic anemia 07/13/2011   Hb 10.9 MCV 83.5    Osteopenia    [facility] @ [facility]   Osteoporosis    Transaminitis 07/13/2011   Borderline  SGPT 38  06/18/11 normal SGOT   Vitamin D deficiency 2008    Past Surgical History:  Procedure Laterality Date   CARPAL TUNNEL RELEASE Right    RUE   COLONOSCOPY  2007   Negative; Dr Carlean Purl   UPPER GASTROINTESTINAL ENDOSCOPY  2007   hiatal hernia    Family History  Problem Relation Age of Onset   Hyperlipidemia Father    Aortic aneurysm Father        smoker   Osteoporosis Mother    Hyperlipidemia Mother    Hypothyroidism Mother    CVA Mother 93   Diabetes Brother        died in sleep in context of pulmonary infection @ 38   Hyperlipidemia Brother    Clotting disorder Brother        factor 5 deficiency with DVT   Diabetes Maternal Grandmother     Heart attack Neg Hx     Social History   Socioeconomic History   Marital status: Married    Spouse name: Not on file   Number of children: Not on file   Years of education: Not on file   Highest education level: Not on file  Occupational History   Not on file  Tobacco Use   Smoking status: Never   Smokeless tobacco: Never  Substance and Sexual Activity   Alcohol use: No    Alcohol/week: 0.0 standard drinks    Comment: rarely   Drug use: No   Sexual activity: Yes    Partners: Male  Other Topics Concern   Not on file  Social History Narrative   Not on file   Social Determinants of Health   Financial Resource Strain: Not on file  Food Insecurity: Not on file  Transportation Needs: Not on file  Physical Activity: Not on file  Stress: Not on file  Social Connections: Not on file  Intimate Partner Violence: Not on file    Outpatient Medications Prior to Visit  Medication Sig Dispense Refill   alendronate (FOSAMAX) 70  MG tablet TAKE 1 TABLET BY MOUTH  WEEKLY WITH 8 OZ OF PLAIN  WATER 30 MINUTES BEFORE  FIRST FOOD, DRINK OR MEDS.  STAY UPRIGHT FOR 30 MINS 12 tablet 1   aspirin EC 81 MG tablet Take 81 mg by mouth daily.     calcium carbonate (OS-CAL) 600 MG TABS Take 600 mg by mouth daily.      Omega-3 Fatty Acids (FISH OIL) 1000 MG CAPS Take 1,000 mg by mouth daily.      pravastatin (PRAVACHOL) 20 MG tablet Take 1 tablet (20 mg total) by mouth daily. 90 tablet 3   cephALEXin (KEFLEX) 500 MG capsule Take 1 capsule (500 mg total) by mouth 3 (three) times daily. 21 capsule 0   No facility-administered medications prior to visit.    No Known Allergies  Review of Systems  Constitutional:  Negative for fever and malaise/fatigue.  HENT:  Negative for congestion.   Eyes:  Negative for blurred vision.  Respiratory:  Negative for shortness of breath.   Cardiovascular:  Negative for chest pain, palpitations and leg swelling.  Gastrointestinal:  Negative for abdominal pain,  blood in stool and nausea.  Genitourinary:  Negative for dysuria and frequency.  Musculoskeletal:  Negative for falls.  Skin:  Positive for itching (on rash sites) and rash (chest, in between breasts, under left breast).  Neurological:  Negative for dizziness, loss of consciousness and headaches.  Endo/Heme/Allergies:  Negative for environmental allergies.  Psychiatric/Behavioral:  Negative for depression. The patient is not nervous/anxious.       Objective:    Physical Exam Vitals and nursing note reviewed.  Constitutional:      General: She is not in acute distress.    Appearance: Normal appearance. She is not ill-appearing.  HENT:     Head: Normocephalic and atraumatic.     Right Ear: External ear normal.     Left Ear: External ear normal.  Eyes:     Extraocular Movements: Extraocular movements intact.     Pupils: Pupils are equal, round, and reactive to light.  Cardiovascular:     Rate and Rhythm: Normal rate and regular rhythm.     Heart sounds: Normal heart sounds. No murmur heard.   No gallop.  Pulmonary:     Effort: Pulmonary effort is normal. No respiratory distress.     Breath sounds: Normal breath sounds. No wheezing or rales.  Skin:    General: Skin is warm and dry.     Findings: Rash (Vesicular rash on chest, under left breast, in between both breasts) present. Rash is vesicular.       Neurological:     Mental Status: She is alert and oriented to person, place, and time.  Psychiatric:        Behavior: Behavior normal.        Judgment: Judgment normal.    BP 102/70 (BP Location: Right Arm, Patient Position: Sitting, Cuff Size: Normal)   Pulse 83   Temp 98.1 F (36.7 C) (Oral)   Resp 18   Ht 5\' 2"  (1.575 m)   Wt 121 lb (54.9 kg)   LMP 02/16/1999   SpO2 97%   BMI 22.13 kg/m  Wt Readings from Last 3 Encounters:  01/23/21 121 lb (54.9 kg)  10/30/20 120 lb (54.4 kg)  08/16/19 125 lb (56.7 kg)    Diabetic Foot Exam - Simple   No data filed    Lab  Results  Component Value Date   WBC 4.9 10/30/2020  HGB 13.4 10/30/2020   HCT 39.6 10/30/2020   PLT 249.0 10/30/2020   GLUCOSE 85 10/30/2020   CHOL 149 10/30/2020   TRIG 75.0 10/30/2020   HDL 46.10 10/30/2020   LDLCALC 88 10/30/2020   ALT 16 10/30/2020   AST 21 10/30/2020   NA 139 10/30/2020   K 4.1 10/30/2020   CL 102 10/30/2020   CREATININE 0.74 10/30/2020   BUN 13 10/30/2020   CO2 30 10/30/2020   TSH 3.30 10/30/2020   HGBA1C 5.9 10/30/2020    Lab Results  Component Value Date   TSH 3.30 10/30/2020   Lab Results  Component Value Date   WBC 4.9 10/30/2020   HGB 13.4 10/30/2020   HCT 39.6 10/30/2020   MCV 89.5 10/30/2020   PLT 249.0 10/30/2020   Lab Results  Component Value Date   NA 139 10/30/2020   K 4.1 10/30/2020   CO2 30 10/30/2020   GLUCOSE 85 10/30/2020   BUN 13 10/30/2020   CREATININE 0.74 10/30/2020   BILITOT 0.7 10/30/2020   ALKPHOS 48 10/30/2020   AST 21 10/30/2020   ALT 16 10/30/2020   PROT 7.1 10/30/2020   ALBUMIN 4.4 10/30/2020   CALCIUM 9.8 10/30/2020   GFR 84.61 10/30/2020   Lab Results  Component Value Date   CHOL 149 10/30/2020   Lab Results  Component Value Date   HDL 46.10 10/30/2020   Lab Results  Component Value Date   LDLCALC 88 10/30/2020   Lab Results  Component Value Date   TRIG 75.0 10/30/2020   Lab Results  Component Value Date   CHOLHDL 3 10/30/2020   Lab Results  Component Value Date   HGBA1C 5.9 10/30/2020       Assessment & Plan:   Problem List Items Addressed This Visit       Unprioritized   Dermatitis due to unknown cause - Primary    Depo medrol 80 mg IM pred taper  Can use benadryl at night for itching       Relevant Medications   predniSONE (DELTASONE) 10 MG tablet     Meds ordered this encounter  Medications   predniSONE (DELTASONE) 10 MG tablet    Sig: TAKE 3 TABLETS PO QD FOR 3 DAYS THEN TAKE 2 TABLETS PO QD FOR 3 DAYS THEN TAKE 1 TABLET PO QD FOR 3 DAYS THEN TAKE 1/2 TAB PO QD  FOR 3 DAYS    Dispense:  20 tablet    Refill:  0   methylPREDNISolone acetate (DEPO-MEDROL) injection 80 mg    I, Dr. Roma Schanz, DO, personally preformed the services described in this documentation.  All medical record entries made by the scribe were at my direction and in my presence.  I have reviewed the chart and discharge instructions (if applicable) and agree that the record reflects my personal performance and is accurate and complete. 01/23/2021   I,Shehryar Baig,acting as a scribe for Ann Held, DO.,have documented all relevant documentation on the behalf of Ann Held, DO,as directed by  Ann Held, DO while in the presence of Ann Held, DO.   Ann Held, DO

## 2021-01-23 NOTE — Assessment & Plan Note (Signed)
Depo medrol 80 mg IM pred taper  Can use benadryl at night for itching

## 2021-01-23 NOTE — Patient Instructions (Signed)
Contact Dermatitis Dermatitis is redness, soreness, and swelling (inflammation) of the skin. Contact dermatitis is a reaction to certain substances that touch the skin. Many different substances can cause contact dermatitis. There are two types of contact dermatitis: Irritant contact dermatitis. This type is caused by something that irritates your skin, such as having dry hands from washing them too often with soap. This type does not require previous exposure to the substance for a reaction to occur. This is the most common type. Allergic contact dermatitis. This type is caused by a substance that you are allergic to, such as poison ivy. This type occurs when you have been exposed to the substance (allergen) and develop a sensitivity to it. Dermatitis may develop soon after your first exposure to the allergen, or it may not develop until the next time you are exposed and every time thereafter. What are the causes? Irritant contact dermatitis is most commonly caused by exposure to: Makeup. Soaps. Detergents. Bleaches. Acids. Metal salts, such as nickel. Allergic contact dermatitis is most commonly caused by exposure to: Poisonous plants. Chemicals. Jewelry. Latex. Medicines. Preservatives in products, such as clothing. What increases the risk? You are more likely to develop this condition if you have: A job that exposes you to irritants or allergens. Certain medical conditions, such as asthma or eczema. What are the signs or symptoms? Symptoms of this condition may occur on your body anywhere the irritant has touched you or is touched by you. Symptoms include: Dryness or flaking. Redness. Cracks. Itching. Pain or a burning feeling. Blisters. Drainage of small amounts of blood or clear fluid from skin cracks. With allergic contact dermatitis, there may also be swelling in areas such asthe eyelids, mouth, or genitals. How is this diagnosed? This condition is diagnosed with a medical  history and physical exam. A patch skin test may be performed to help determine the cause. If the condition is related to your job, you may need to see an occupational medicine specialist. How is this treated? This condition is treated by checking for the cause of the reaction and protecting your skin from further contact. Treatment may also include: Steroid creams or ointments. Oral steroid medicines may be needed in more severe cases. Antibiotic medicines or antibacterial ointments, if a skin infection is present. Antihistamine lotion or an antihistamine taken by mouth to ease itching. A bandage (dressing). Follow these instructions at home: Skin care Moisturize your skin as needed. Apply cool compresses to the affected areas. Try applying baking soda paste to your skin. Stir water into baking soda until it reaches a paste-like consistency. Do not scratch your skin, and avoid friction to the affected area. Avoid the use of soaps, perfumes, and dyes. Medicines Take or apply over-the-counter and prescription medicines only as told by your health care provider. If you were prescribed an antibiotic medicine, take or apply the antibiotic as told by your health care provider. Do not stop using the antibiotic even if your condition improves. Bathing Try taking a bath with: Epsom salts. Follow the instructions on the packaging. You can get these at your local pharmacy or grocery store. Baking soda. Pour a small amount into the bath as directed by your health care provider. Colloidal oatmeal. Follow the instructions on the packaging. You can get this at your local pharmacy or grocery store. Bathe less frequently, such as every other day. Bathe in lukewarm water. Avoid using hot water. Bandage care If you were given a bandage (dressing), change it as told by   your health care provider. Wash your hands with soap and water before and after you change your dressing. If soap and water are not  available, use hand sanitizer. General instructions Avoid the substance that caused your reaction. If you do not know what caused it, keep a journal to try to track what caused it. Write down: What you eat. What cosmetic products you use. What you drink. What you wear in the affected area. This includes jewelry. Check the affected areas every day for signs of infection. Check for: More redness, swelling, or pain. More fluid or blood. Warmth. Pus or a bad smell. Keep all follow-up visits as told by your health care provider. This is important. Contact a health care provider if: Your condition does not improve with treatment. Your condition gets worse. You have signs of infection such as swelling, tenderness, redness, soreness, or warmth in the affected area. You have a fever. You have new symptoms. Get help right away if: You have a severe headache, neck pain, or neck stiffness. You vomit. You feel very sleepy. You notice red streaks coming from the affected area. Your bone or joint underneath the affected area becomes painful after the skin has healed. The affected area turns darker. You have difficulty breathing. Summary Dermatitis is redness, soreness, and swelling (inflammation) of the skin. Contact dermatitis is a reaction to certain substances that touch the skin. Symptoms of this condition may occur on your body anywhere the irritant has touched you or is touched by you. This condition is treated by figuring out what caused the reaction and protecting your skin from further contact. Treatment may also include medicines and skin care. Avoid the substance that caused your reaction. If you do not know what caused it, keep a journal to try to track what caused it. Contact a health care provider if your condition gets worse or you have signs of infection such as swelling, tenderness, redness, soreness, or warmth in the affected area. This information is not intended to replace  advice given to you by your health care provider. Make sure you discuss any questions you have with your healthcare provider. Document Revised: 07/27/2018 Document Reviewed: 10/20/2017 [employer] Patient Education  2022 [employer]  

## 2021-02-24 ENCOUNTER — Ambulatory Visit (INDEPENDENT_AMBULATORY_CARE_PROVIDER_SITE_OTHER): Payer: Medicare (Managed Care)

## 2021-02-24 ENCOUNTER — Other Ambulatory Visit: Payer: Self-pay

## 2021-02-24 VITALS — BP 124/68 | HR 66 | Temp 98.1°F | Resp 16 | Ht 62.0 in | Wt 118.8 lb

## 2021-02-24 DIAGNOSIS — Z0000 Encounter for general adult medical examination without abnormal findings: Secondary | ICD-10-CM | POA: Diagnosis not present

## 2021-02-24 NOTE — Progress Notes (Signed)
Subjective:   Shannon West is a 65 y.o. female who presents for an Initial Medicare Annual Wellness Visit.  Review of Systems     Cardiac Risk Factors include: advanced age (>28men, >71 women);dyslipidemia     Objective:    Today's Vitals   02/24/21 1056  BP: 124/68  Pulse: 66  Resp: 16  Temp: 98.1 F (36.7 C)  TempSrc: Temporal  SpO2: 99%  Weight: 118 lb 12.8 oz (53.9 kg)  Height: 5\' 2"  (1.575 m)   Body mass index is 21.73 kg/m.  Advanced Directives 02/24/2021 04/24/2016  Does Patient Have a Medical Advance Directive? Yes Yes  Type of Paramedic of [place];Living will [place];Living will  Copy of [place] in Chart? No - copy requested No - copy requested    Current Medications (verified) Outpatient Encounter Medications as of 02/24/2021  Medication Sig   alendronate (FOSAMAX) 70 MG tablet TAKE 1 TABLET BY MOUTH  WEEKLY WITH 8 OZ OF PLAIN  WATER 30 MINUTES BEFORE  FIRST FOOD, DRINK OR MEDS.  STAY UPRIGHT FOR 30 MINS   aspirin EC 81 MG tablet Take 81 mg by mouth daily.   calcium carbonate (OS-CAL) 600 MG TABS Take 600 mg by mouth daily.    cholecalciferol (VITAMIN D3) 25 MCG (1000 UNIT) tablet Take 1,000 Units by mouth daily.   Omega-3 Fatty Acids (FISH OIL) 1000 MG CAPS Take 1,000 mg by mouth daily.    pravastatin (PRAVACHOL) 20 MG tablet Take 1 tablet (20 mg total) by mouth daily.   [DISCONTINUED] predniSONE (DELTASONE) 10 MG tablet TAKE 3 TABLETS PO QD FOR 3 DAYS THEN TAKE 2 TABLETS PO QD FOR 3 DAYS THEN TAKE 1 TABLET PO QD FOR 3 DAYS THEN TAKE 1/2 TAB PO QD FOR 3 DAYS   No facility-administered encounter medications on file as of 02/24/2021.    Allergies (verified) Patient has no known allergies.   History: Past Medical History:  Diagnosis Date   Clotting disorder ([place])    Factor 5 Leiden mutation, heterozygous ([place])    Dr Beryle Beams, Hematology   GERD (gastroesophageal reflux disease)     History of anemia    Hyperlipemia    Hypochromic-microcytic anemia 07/13/2011   Hb 10.9 MCV 83.5    Osteopenia    [facility]   Osteoporosis    Transaminitis 07/13/2011   Borderline  SGPT 38  06/18/11 normal SGOT   Vitamin D deficiency 2008   Past Surgical History:  Procedure Laterality Date   CARPAL TUNNEL RELEASE Right    RUE   COLONOSCOPY  2007   Negative; Dr Carlean Purl   UPPER GASTROINTESTINAL ENDOSCOPY  2007   hiatal hernia   Family History  Problem Relation Age of Onset   Hyperlipidemia Father    Aortic aneurysm Father        smoker   Osteoporosis Mother    Hyperlipidemia Mother    Hypothyroidism Mother    CVA Mother 54   Diabetes Brother        died in sleep in context of pulmonary infection @ 72   Hyperlipidemia Brother    Clotting disorder Brother        factor 5 deficiency with DVT   Diabetes Maternal Grandmother    Heart attack Neg Hx    Social History   Socioeconomic History   Marital status: Married    Spouse name: Not on file   Number of children: Not on file  Years of education: Not on file   Highest education level: Not on file  Occupational History   Not on file  Tobacco Use   Smoking status: Never   Smokeless tobacco: Never  Substance and Sexual Activity   Alcohol use: No    Alcohol/week: 0.0 standard drinks    Comment: rarely   Drug use: No   Sexual activity: Yes    Partners: Male  Other Topics Concern   Not on file  Social History Narrative   Not on file   Social Determinants of Health   Financial Resource Strain: Low Risk    Difficulty of Paying Living Expenses: Not hard at all  Food Insecurity: No Food Insecurity   Worried About Charity fundraiser in the Last Year: Never true   [place] in the Last Year: Never true  Transportation Needs: No Transportation Needs   Lack of Transportation (Medical): No   Lack of Transportation (Non-Medical): No  Physical Activity: Inactive   Days of Exercise per Week: 0 days    Minutes of Exercise per Session: 0 min  Stress: No Stress Concern Present   Feeling of Stress : Not at all  Social Connections: Socially Integrated   Frequency of Communication with Friends and Family: More than three times a week   Frequency of Social Gatherings with Friends and Family: More than three times a week   Attends Religious Services: More than 4 times per year   Active Member of [employer] or Organizations: Yes   Attends Music therapist: More than 4 times per year   Marital Status: Married    Tobacco Counseling Counseling given: Not Answered   Clinical Intake:  Pre-visit preparation completed: Yes        BMI - recorded: 21.73 Nutritional Status: BMI of 19-24  Normal Nutritional Risks: None Diabetes: No  How often do you need to have someone help you when you read instructions, pamphlets, or other written materials from your doctor or pharmacy?: 1 - Never  Diabetic?No  Interpreter Needed?: No  Information entered by :: Caroleen Hamman LPN   Activities of Daily Living In your present state of health, do you have any difficulty performing the following activities: 02/24/2021  Hearing? N  Vision? N  Difficulty concentrating or making decisions? N  Walking or climbing stairs? N  Dressing or bathing? N  Doing errands, shopping? N  Preparing Food and eating ? N  Using the Toilet? N  In the past six months, have you accidently leaked urine? Y  Comment occasionally  Do you have problems with loss of bowel control? N  Managing your Medications? N  Managing your Finances? N  Housekeeping or managing your Housekeeping? N  Some recent data might be hidden    Patient Care Team: Copland, Gay Filler, MD as PCP - General (Family Medicine)  Indicate any recent Medical Services you may have received from other than [facility] providers in the past year (date may be approximate).     Assessment:   This is a routine wellness examination for  Shannon West.  Hearing/Vision screen Hearing Screening - Comments:: No issues Vision Screening - Comments:: Last eye exam-09/2020-Dr. Kathlen Mody Contacts  Dietary issues and exercise activities discussed: Current Exercise Habits: The patient does not participate in regular exercise at present, Exercise limited by: None identified   Goals Addressed             This Visit's Progress    Patient Stated  Maintain current current health       Depression Screen PHQ 2/9 Scores 02/24/2021 06/03/2017 03/01/2013  PHQ - 2 Score 0 0 0    Fall Risk Fall Risk  02/24/2021 03/01/2013  Falls in the past year? 0 No  Number falls in past yr: 0 -  Injury with Fall? 0 -  Follow up Falls prevention discussed -    FALL RISK PREVENTION PERTAINING TO THE HOME:  Any stairs in or around the home? No  Home free of loose throw rugs in walkways, pet beds, electrical cords, etc? Yes  Adequate lighting in your home to reduce risk of falls? Yes   ASSISTIVE DEVICES UTILIZED TO PREVENT FALLS:  Life alert? No  Use of a cane, walker or w/c? No  Grab bars in the bathroom? No  Shower chair or bench in shower? Yes  Elevated toilet seat or a handicapped toilet? No   TIMED UP AND GO:  Was the test performed? Yes .  Length of time to ambulate 10 feet: 11 sec.   Gait steady and fast without use of assistive device  Cognitive Function:Normal cognitive status assessed by direct observation by this Nurse Health Advisor. No abnormalities found.          Immunizations Immunization History  Administered Date(s) Administered   Influenza Whole 01/19/2011   Influenza-Unspecified 03/05/2015, 02/24/2017, 02/13/2021   PFIZER(Purple Top)SARS-COV-2 Vaccination 08/03/2019, 08/23/2019, 03/20/2020   PNEUMOCOCCAL CONJUGATE-20 10/30/2020   Td 04/20/2004   Tdap 05/21/2016   Zoster Recombinat (Shingrix) 10/23/2020, 02/19/2021    TDAP status: Up to date  Flu Vaccine status: Up to date  Pneumococcal vaccine status:  Up to date  Covid-19 vaccine status: Information provided on how to obtain vaccines.   Qualifies for Shingles Vaccine? No   Zostavax completed No   Shingrix Completed?: Yes  Screening Tests Health Maintenance  Topic Date Due   COVID-19 Vaccine (4 - Booster for Pfizer series) 05/15/2020   COLONOSCOPY (Pts 45-24yrs Insurance coverage will need to be confirmed)  05/08/2021   MAMMOGRAM  09/20/2022   TETANUS/TDAP  05/21/2026   Pneumonia Vaccine 35+ Years old  Completed   INFLUENZA VACCINE  Completed   DEXA SCAN  Completed   Hepatitis C Screening  Completed   Zoster Vaccines- Shingrix  Completed   HPV VACCINES  Aged Out    Health Maintenance  Health Maintenance Due  Topic Date Due   COVID-19 Vaccine (4 - Booster for [place] series) 05/15/2020    Colorectal cancer screening: Type of screening: Colonoscopy. Completed 05/08/2016. Repeat every 5 years  Mammogram status: Completed bilateral 09/19/2020. Repeat every year  Bone Density status: Completed 05/24/2019. Results reflect: Bone density results: OSTEOPOROSIS. Repeat every 2 years.  Lung Cancer Screening: (Low Dose CT Chest recommended if Age 36-80 years, 30 pack-year currently smoking OR have quit w/in 15years.) does not qualify.     Additional Screening:  Hepatitis C Screening: Completed 05/22/2016  Vision Screening: Recommended annual ophthalmology exams for early detection of glaucoma and other disorders of the eye. Is the patient up to date with their annual eye exam?  Yes  Who is the provider or what is the name of the office in which the patient attends annual eye exams? Dr. Sharen Counter   Dental Screening: Recommended annual dental exams for proper oral hygiene  Community Resource Referral / Chronic Care Management: CRR required this visit?  No   CCM required this visit?  No      Plan:     I  have personally reviewed and noted the following in the patient's chart:   Medical and social history Use of alcohol, tobacco  or illicit drugs  Current medications and supplements including opioid prescriptions. Patient is not currently taking opioid prescriptions. Functional ability and status Nutritional status Physical activity Advanced directives List of other physicians Hospitalizations, surgeries, and ER visits in previous 12 months Vitals Screenings to include cognitive, depression, and falls Referrals and appointments  In addition, I have reviewed and discussed with patient certain preventive protocols, quality metrics, and best practice recommendations. A written personalized care plan for preventive services as well as general preventive health recommendations were provided to patient.   Patient to access avs on mychart  Marta Antu, LPN   53/09/[id number]  Nurse Health Advisor  Nurse Notes: None

## 2021-02-24 NOTE — Patient Instructions (Addendum)
[name] , Thank you for taking time to come for your Medicare Wellness Visit. I appreciate your ongoing commitment to your health goals. Please review the following plan we discussed and let me know if I can assist you in the future.   Screening recommendations/referrals: Colonoscopy: Completed 05/08/2016-Due 05/08/2021 Mammogram: Completed 09/19/2020-Due 09/19/2021 Bone Density: Completed 05/24/2019-Due 05/23/2021 Recommended yearly ophthalmology/optometry visit for glaucoma screening and checkup Recommended yearly dental visit for hygiene and checkup  Vaccinations: Influenza vaccine: Up to date Pneumococcal vaccine: Up to date Tdap vaccine: Up to date-Due-05/21/2026 Shingles vaccine: Completed vaccines   Covid-19:Booster available at the pharmacy  Advanced directives: Please bring a copy of Living Will and/or [place] for your chart.   Conditions/risks identified: see problem list  Next appointment: Follow up in one year for your annual wellness visit    Preventive Care 65 Years and Older, Female Preventive care refers to lifestyle choices and visits with your health care provider that can promote health and wellness. What does preventive care include? A yearly physical exam. This is also called an annual well check. Dental exams once or twice a year. Routine eye exams. Ask your health care provider how often you should have your eyes checked. Personal lifestyle choices, including: Daily care of your teeth and gums. Regular physical activity. Eating a healthy diet. Avoiding tobacco and drug use. Limiting alcohol use. Practicing safe sex. Taking low-dose aspirin every day. Taking vitamin and mineral supplements as recommended by your health care provider. What happens during an annual well check? The services and screenings done by your health care provider during your annual well check will depend on your age, overall health, lifestyle risk factors, and family  history of disease. Counseling  Your health care provider may ask you questions about your: Alcohol use. Tobacco use. Drug use. Emotional well-being. Home and relationship well-being. Sexual activity. Eating habits. History of falls. Memory and ability to understand (cognition). Work and work Statistician. Reproductive health. Screening  You may have the following tests or measurements: Height, weight, and BMI. Blood pressure. Lipid and cholesterol levels. These may be checked every 5 years, or more frequently if you are over 70 years old. Skin check. Lung cancer screening. You may have this screening every year starting at age 64 if you have a 30-pack-year history of smoking and currently smoke or have quit within the past 15 years. Fecal occult blood test (FOBT) of the stool. You may have this test every year starting at age 89. Flexible sigmoidoscopy or colonoscopy. You may have a sigmoidoscopy every 5 years or a colonoscopy every 10 years starting at age 15. Hepatitis C blood test. Hepatitis B blood test. Sexually transmitted disease (STD) testing. Diabetes screening. This is done by checking your blood sugar (glucose) after you have not eaten for a while (fasting). You may have this done every 1-3 years. Bone density scan. This is done to screen for osteoporosis. You may have this done starting at age 32. Mammogram. This may be done every 1-2 years. Talk to your health care provider about how often you should have regular mammograms. Talk with your health care provider about your test results, treatment options, and if necessary, the need for more tests. Vaccines  Your health care provider may recommend certain vaccines, such as: Influenza vaccine. This is recommended every year. Tetanus, diphtheria, and acellular pertussis (Tdap, Td) vaccine. You may need a Td booster every 10 years. Zoster vaccine. You may need this after age 63. Pneumococcal  13-valent conjugate (PCV13)  vaccine. One dose is recommended after age 27. Pneumococcal polysaccharide (PPSV23) vaccine. One dose is recommended after age 72. Talk to your health care provider about which screenings and vaccines you need and how often you need them. This information is not intended to replace advice given to you by your health care provider. Make sure you discuss any questions you have with your health care provider. Document Released: 05/03/2015 Document Revised: 12/25/2015 Document Reviewed: 02/05/2015 Elsevier Interactive Patient Education  2017 [place] Prevention in the Home Falls can cause injuries. They can happen to people of all ages. There are many things you can do to make your home safe and to help prevent falls. What can I do on the outside of my home? Regularly fix the edges of walkways and driveways and fix any cracks. Remove anything that might make you trip as you walk through a door, such as a raised step or threshold. Trim any bushes or trees on the path to your home. Use bright outdoor lighting. Clear any walking paths of anything that might make someone trip, such as rocks or tools. Regularly check to see if handrails are loose or broken. Make sure that both sides of any steps have handrails. Any raised decks and porches should have guardrails on the edges. Have any leaves, snow, or ice cleared regularly. Use sand or salt on walking paths during winter. Clean up any spills in your garage right away. This includes oil or grease spills. What can I do in the bathroom? Use night lights. Install grab bars by the toilet and in the tub and shower. Do not use towel bars as grab bars. Use non-skid mats or decals in the tub or shower. If you need to sit down in the shower, use a plastic, non-slip stool. Keep the floor dry. Clean up any water that spills on the floor as soon as it happens. Remove soap buildup in the tub or shower regularly. Attach bath mats securely with  double-sided non-slip rug tape. Do not have throw rugs and other things on the floor that can make you trip. What can I do in the bedroom? Use night lights. Make sure that you have a light by your bed that is easy to reach. Do not use any sheets or blankets that are too big for your bed. They should not hang down onto the floor. Have a firm chair that has side arms. You can use this for support while you get dressed. Do not have throw rugs and other things on the floor that can make you trip. What can I do in the kitchen? Clean up any spills right away. Avoid walking on wet floors. Keep items that you use a lot in easy-to-reach places. If you need to reach something above you, use a strong step stool that has a grab bar. Keep electrical cords out of the way. Do not use floor polish or wax that makes floors slippery. If you must use wax, use non-skid floor wax. Do not have throw rugs and other things on the floor that can make you trip. What can I do with my stairs? Do not leave any items on the stairs. Make sure that there are handrails on both sides of the stairs and use them. Fix handrails that are broken or loose. Make sure that handrails are as long as the stairways. Check any carpeting to make sure that it is firmly attached to the stairs. Fix any carpet  that is loose or worn. Avoid having throw rugs at the top or bottom of the stairs. If you do have throw rugs, attach them to the floor with carpet tape. Make sure that you have a light switch at the top of the stairs and the bottom of the stairs. If you do not have them, ask someone to add them for you. What else can I do to help prevent falls? Wear shoes that: Do not have high heels. Have rubber bottoms. Are comfortable and fit you well. Are closed at the toe. Do not wear sandals. If you use a stepladder: Make sure that it is fully opened. Do not climb a closed stepladder. Make sure that both sides of the stepladder are locked  into place. Ask someone to hold it for you, if possible. Clearly mark and make sure that you can see: Any grab bars or handrails. First and last steps. Where the edge of each step is. Use tools that help you move around (mobility aids) if they are needed. These include: Canes. Walkers. Scooters. Crutches. Turn on the lights when you go into a dark area. Replace any light bulbs as soon as they burn out. Set up your furniture so you have a clear path. Avoid moving your furniture around. If any of your floors are uneven, fix them. If there are any pets around you, be aware of where they are. Review your medicines with your doctor. Some medicines can make you feel dizzy. This can increase your chance of falling. Ask your doctor what other things that you can do to help prevent falls. This information is not intended to replace advice given to you by your health care provider. Make sure you discuss any questions you have with your health care provider. Document Released: 01/31/2009 Document Revised: 09/12/2015 Document Reviewed: 05/11/2014 Elsevier Interactive Patient Education  2017 [employer].

## 2021-07-07 ENCOUNTER — Other Ambulatory Visit: Payer: Self-pay | Admitting: Family Medicine

## 2021-07-07 DIAGNOSIS — M810 Age-related osteoporosis without current pathological fracture: Secondary | ICD-10-CM

## 2021-07-14 ENCOUNTER — Other Ambulatory Visit: Payer: Self-pay | Admitting: Family Medicine

## 2021-07-14 DIAGNOSIS — E782 Mixed hyperlipidemia: Secondary | ICD-10-CM

## 2021-10-13 ENCOUNTER — Other Ambulatory Visit: Payer: Self-pay | Admitting: Family Medicine

## 2021-10-13 DIAGNOSIS — E782 Mixed hyperlipidemia: Secondary | ICD-10-CM

## 2021-10-30 DIAGNOSIS — H43813 Vitreous degeneration, bilateral: Secondary | ICD-10-CM | POA: Diagnosis not present

## 2021-10-30 DIAGNOSIS — H33312 Horseshoe tear of retina without detachment, left eye: Secondary | ICD-10-CM | POA: Diagnosis not present

## 2021-10-30 DIAGNOSIS — H4312 Vitreous hemorrhage, left eye: Secondary | ICD-10-CM | POA: Diagnosis not present

## 2021-10-30 DIAGNOSIS — H43393 Other vitreous opacities, bilateral: Secondary | ICD-10-CM | POA: Diagnosis not present

## 2021-11-04 DIAGNOSIS — H33312 Horseshoe tear of retina without detachment, left eye: Secondary | ICD-10-CM | POA: Diagnosis not present

## 2021-11-05 ENCOUNTER — Other Ambulatory Visit (HOSPITAL_BASED_OUTPATIENT_CLINIC_OR_DEPARTMENT_OTHER): Payer: Self-pay | Admitting: Family Medicine

## 2021-11-05 DIAGNOSIS — Z1231 Encounter for screening mammogram for malignant neoplasm of breast: Secondary | ICD-10-CM

## 2021-11-06 ENCOUNTER — Ambulatory Visit (INDEPENDENT_AMBULATORY_CARE_PROVIDER_SITE_OTHER): Payer: Medicare (Managed Care)

## 2021-11-06 DIAGNOSIS — Z1231 Encounter for screening mammogram for malignant neoplasm of breast: Secondary | ICD-10-CM

## 2021-11-17 DIAGNOSIS — H35421 Microcystoid degeneration of retina, right eye: Secondary | ICD-10-CM | POA: Diagnosis not present

## 2021-11-17 DIAGNOSIS — H43393 Other vitreous opacities, bilateral: Secondary | ICD-10-CM | POA: Diagnosis not present

## 2021-11-17 DIAGNOSIS — H31092 Other chorioretinal scars, left eye: Secondary | ICD-10-CM | POA: Diagnosis not present

## 2021-11-17 DIAGNOSIS — H4312 Vitreous hemorrhage, left eye: Secondary | ICD-10-CM | POA: Diagnosis not present

## 2021-12-29 DIAGNOSIS — H4312 Vitreous hemorrhage, left eye: Secondary | ICD-10-CM | POA: Diagnosis not present

## 2021-12-29 DIAGNOSIS — H43813 Vitreous degeneration, bilateral: Secondary | ICD-10-CM | POA: Diagnosis not present

## 2021-12-29 DIAGNOSIS — H35421 Microcystoid degeneration of retina, right eye: Secondary | ICD-10-CM | POA: Diagnosis not present

## 2021-12-29 DIAGNOSIS — H31093 Other chorioretinal scars, bilateral: Secondary | ICD-10-CM | POA: Diagnosis not present

## 2022-01-16 DIAGNOSIS — Z0000 Encounter for general adult medical examination without abnormal findings: Secondary | ICD-10-CM | POA: Diagnosis not present

## 2022-01-20 ENCOUNTER — Telehealth: Payer: Self-pay

## 2022-01-20 ENCOUNTER — Encounter: Payer: Self-pay | Admitting: Family Medicine

## 2022-01-20 DIAGNOSIS — Z130 Encounter for screening for diseases of the blood and blood-forming organs and certain disorders involving the immune mechanism: Secondary | ICD-10-CM

## 2022-01-20 DIAGNOSIS — R7303 Prediabetes: Secondary | ICD-10-CM

## 2022-01-20 DIAGNOSIS — E782 Mixed hyperlipidemia: Secondary | ICD-10-CM

## 2022-01-20 DIAGNOSIS — Z1329 Encounter for screening for other suspected endocrine disorder: Secondary | ICD-10-CM

## 2022-01-20 NOTE — Addendum Note (Signed)
Addended by: Lamar Blinks C on: 01/20/2022 03:31 PM   Modules accepted: Orders

## 2022-01-20 NOTE — Telephone Encounter (Signed)
Pt would like a lab appointment on Monday morning for fasting labs prior to appt Wed. Okay for orders?

## 2022-02-01 NOTE — Patient Instructions (Incomplete)
It was great to see you again today, I will be in touch with your labs as soon as possible Recommend the latest COVID-19 booster and a dose of RSV this fall if not done already  Please call GI and see if you are due for a recheck scope  Address: [address], [place], [place] [address] Phone: [phone]  Do bone density and heart CT at your convenience at [facility]   Try trazodone as needed for sleep Best of luck with caring for your mom- I am sorry you are going through so much right now

## 2022-02-01 NOTE — Progress Notes (Unsigned)
[place] at [facility] [address] [place], [place] [address] [phone]  Date:  02/04/2022   Name:  Shannon West   DOB:  02/25/1956   MRN:  [id number]  PCP:  Darreld Mclean, MD    Chief Complaint: No chief complaint on file.   History of Present Illness:  Shannon West is a 66 y.o. very pleasant female patient who presents with the following:  Patient seen today for physical exam- History of prediabetes, hyperlipidemia, osteoporosis, factor V Leiden carrier/microcytic anemia Most recent visit with myself July 2022.  That time she was generally doing well.  She was caring for her 13 year old mother who has dementia, also involved with her 2 grandchildren  COVID-12 booster recommended Colon cancer screening needed Flu shot Mammogram up-to-date DEXA scan can be updated Pap done last year, negative  Fosamax Aspirin Vitamin D and calcium Omega-3 Pravastatin  Can offer coronary calcium  Patient had her lab work drawn earlier this week A1c is stable Results for orders placed or performed in visit on 02/02/22  TSH  Result Value Ref Range   TSH 2.80 0.35 - 5.50 uIU/mL  Lipid panel  Result Value Ref Range   Cholesterol 142 0 - 200 mg/dL   Triglycerides 68.0 0.0 - 149.0 mg/dL   HDL 56.60 >39.00 mg/dL   VLDL 13.6 0.0 - 40.0 mg/dL   LDL Cholesterol 72 0 - 99 mg/dL   Total CHOL/HDL Ratio 3    NonHDL 85.44   Hemoglobin A1c  Result Value Ref Range   Hgb A1c MFr Bld 5.9 4.6 - 6.5 %  Comprehensive metabolic panel  Result Value Ref Range   Sodium 139 135 - 145 mEq/L   Potassium 4.5 3.5 - 5.1 mEq/L   Chloride 102 96 - 112 mEq/L   CO2 29 19 - 32 mEq/L   Glucose, Bld 83 70 - 99 mg/dL   BUN 14 6 - 23 mg/dL   Creatinine, Ser 0.71 0.40 - 1.20 mg/dL   Total Bilirubin 0.6 0.2 - 1.2 mg/dL   Alkaline Phosphatase 44 39 - 117 U/L   AST 20 0 - 37 U/L   ALT 14 0 - 35 U/L   Total Protein 7.0 6.0 - 8.3 g/dL   Albumin 4.4  3.5 - 5.2 g/dL   GFR 88.13 >60.00 mL/min   Calcium 9.7 8.4 - 10.5 mg/dL  CBC  Result Value Ref Range   WBC 6.2 4.0 - 10.5 K/uL   RBC 4.26 3.87 - 5.11 Mil/uL   Platelets 282.0 150.0 - 400.0 K/uL   Hemoglobin 12.7 12.0 - 15.0 g/dL   HCT 38.3 36.0 - 46.0 %   MCV 90.0 78.0 - 100.0 fl   MCHC 33.0 30.0 - 36.0 g/dL   RDW 14.8 11.5 - 15.5 %     Patient Active Problem List   Diagnosis Date Noted   Dermatitis due to unknown cause 01/23/2021   Pre-diabetes 06/03/2017   Hypochromic-microcytic anemia 07/13/2011   Transaminitis 07/13/2011   Factor 5 Leiden mutation, heterozygous ([place]) 06/23/2011   VITAMIN D DEFICIENCY 03/24/2010   HYPERLIPIDEMIA 03/03/2007   GERD 09/07/2006   Osteoporosis 09/07/2006    Past Medical History:  Diagnosis Date   Clotting disorder (HCC)    Factor 5 Leiden mutation, heterozygous ([place])    Dr Beryle Beams, Hematology   GERD (gastroesophageal reflux disease)    History of anemia    Hyperlipemia    Hypochromic-microcytic anemia 07/13/2011  Hb 10.9 MCV 83.5    Osteopenia    [facility]   Osteoporosis    Transaminitis 07/13/2011   Borderline  SGPT 38  06/18/11 normal SGOT   Vitamin D deficiency 2008    Past Surgical History:  Procedure Laterality Date   CARPAL TUNNEL RELEASE Right    RUE   COLONOSCOPY  2007   Negative; Dr Carlean Purl   UPPER GASTROINTESTINAL ENDOSCOPY  2007   hiatal hernia    Social History   Tobacco Use   Smoking status: Never   Smokeless tobacco: Never  Substance Use Topics   Alcohol use: No    Alcohol/week: 0.0 standard drinks of alcohol    Comment: rarely   Drug use: No    Family History  Problem Relation Age of Onset   Hyperlipidemia Father    Aortic aneurysm Father        smoker   Osteoporosis Mother    Hyperlipidemia Mother    Hypothyroidism Mother    CVA Mother 49   Diabetes Brother        died in sleep in context of pulmonary infection @ 82   Hyperlipidemia Brother    Clotting disorder Brother         factor 5 deficiency with DVT   Diabetes Maternal Grandmother    Heart attack Neg Hx     No Known Allergies  Medication list has been reviewed and updated.  Current Outpatient Medications on File Prior to Visit  Medication Sig Dispense Refill   alendronate (FOSAMAX) 70 MG tablet TAKE 1 TABLET WEEKLY WITH 8 OUNCES OF PLAIN WATER 30 MINUTES BEFORE FIRST FOOD, DRINK OR MEDICATIONS. STAY UPRIGHT FOR 30 MINUTES 12 tablet 3   aspirin EC 81 MG tablet Take 81 mg by mouth daily.     calcium carbonate (OS-CAL) 600 MG TABS Take 600 mg by mouth daily.      cholecalciferol (VITAMIN D3) 25 MCG (1000 UNIT) tablet Take 1,000 Units by mouth daily.     Omega-3 Fatty Acids (FISH OIL) 1000 MG CAPS Take 1,000 mg by mouth daily.      pravastatin (PRAVACHOL) 20 MG tablet TAKE 1 TABLET DAILY 90 tablet 3   No current facility-administered medications on file prior to visit.    Review of Systems:  As per HPI- otherwise negative.   Physical Examination: There were no vitals filed for this visit. There were no vitals filed for this visit. There is no height or weight on file to calculate BMI. Ideal Body Weight:    GEN: no acute distress. HEENT: Atraumatic, Normocephalic.  Ears and Nose: No external deformity. CV: RRR, No M/G/R. No JVD. No thrill. No extra heart sounds. PULM: CTA B, no wheezes, crackles, rhonchi. No retractions. No resp. distress. No accessory muscle use. ABD: S, NT, ND, +BS. No rebound. No HSM. EXTR: No c/c/e PSYCH: Normally interactive. Conversant.    Assessment and Plan: *** Physical exam today.  Encouraged healthy diet and exercise routine. Will plan further follow- up pending labs.  Signed Lamar Blinks, MD

## 2022-02-02 ENCOUNTER — Other Ambulatory Visit (INDEPENDENT_AMBULATORY_CARE_PROVIDER_SITE_OTHER): Payer: Medicare (Managed Care)

## 2022-02-02 ENCOUNTER — Encounter: Payer: Self-pay | Admitting: Family Medicine

## 2022-02-02 DIAGNOSIS — R7303 Prediabetes: Secondary | ICD-10-CM

## 2022-02-02 DIAGNOSIS — Z1329 Encounter for screening for other suspected endocrine disorder: Secondary | ICD-10-CM | POA: Diagnosis not present

## 2022-02-02 DIAGNOSIS — Z130 Encounter for screening for diseases of the blood and blood-forming organs and certain disorders involving the immune mechanism: Secondary | ICD-10-CM | POA: Diagnosis not present

## 2022-02-02 DIAGNOSIS — E782 Mixed hyperlipidemia: Secondary | ICD-10-CM

## 2022-02-02 LAB — CBC
HCT: 38.3 % (ref 36.0–46.0)
Hemoglobin: 12.7 g/dL (ref 12.0–15.0)
MCHC: 33 g/dL (ref 30.0–36.0)
MCV: 90 fl (ref 78.0–100.0)
Platelets: 282 10*3/uL (ref 150.0–400.0)
RBC: 4.26 Mil/uL (ref 3.87–5.11)
RDW: 14.8 % (ref 11.5–15.5)
WBC: 6.2 10*3/uL (ref 4.0–10.5)

## 2022-02-02 LAB — COMPREHENSIVE METABOLIC PANEL
ALT: 14 U/L (ref 0–35)
AST: 20 U/L (ref 0–37)
Albumin: 4.4 g/dL (ref 3.5–5.2)
Alkaline Phosphatase: 44 U/L (ref 39–117)
BUN: 14 mg/dL (ref 6–23)
CO2: 29 mEq/L (ref 19–32)
Calcium: 9.7 mg/dL (ref 8.4–10.5)
Chloride: 102 mEq/L (ref 96–112)
Creatinine, Ser: 0.71 mg/dL (ref 0.40–1.20)
GFR: 88.13 mL/min (ref >60.00)
Glucose, Bld: 83 mg/dL (ref 70–99)
Potassium: 4.5 mEq/L (ref 3.5–5.1)
Sodium: 139 mEq/L (ref 135–145)
Total Bilirubin: 0.6 mg/dL (ref 0.2–1.2)
Total Protein: 7 g/dL (ref 6.0–8.3)

## 2022-02-02 LAB — LIPID PANEL
Cholesterol: 142 mg/dL (ref 0–200)
HDL: 56.6 mg/dL (ref >39.00)
LDL Cholesterol: 72 mg/dL (ref 0–99)
NonHDL: 85.44
Total CHOL/HDL Ratio: 3
Triglycerides: 68 mg/dL (ref 0.0–149.0)
VLDL: 13.6 mg/dL (ref 0.0–40.0)

## 2022-02-02 LAB — HEMOGLOBIN A1C: Hgb A1c MFr Bld: 5.9 % (ref 4.6–6.5)

## 2022-02-02 LAB — TSH: TSH: 2.8 u[IU]/mL (ref 0.35–5.50)

## 2022-02-04 ENCOUNTER — Encounter: Payer: Self-pay | Admitting: Family Medicine

## 2022-02-04 ENCOUNTER — Ambulatory Visit (INDEPENDENT_AMBULATORY_CARE_PROVIDER_SITE_OTHER): Payer: Medicare (Managed Care) | Admitting: Family Medicine

## 2022-02-04 VITALS — BP 116/68 | HR 63 | Temp 97.6°F | Resp 16 | Ht 62.0 in | Wt 114.5 lb

## 2022-02-04 DIAGNOSIS — E782 Mixed hyperlipidemia: Secondary | ICD-10-CM | POA: Diagnosis not present

## 2022-02-04 DIAGNOSIS — Z130 Encounter for screening for diseases of the blood and blood-forming organs and certain disorders involving the immune mechanism: Secondary | ICD-10-CM

## 2022-02-04 DIAGNOSIS — G4700 Insomnia, unspecified: Secondary | ICD-10-CM

## 2022-02-04 DIAGNOSIS — M810 Age-related osteoporosis without current pathological fracture: Secondary | ICD-10-CM

## 2022-02-04 DIAGNOSIS — Z0000 Encounter for general adult medical examination without abnormal findings: Secondary | ICD-10-CM

## 2022-02-04 DIAGNOSIS — R7303 Prediabetes: Secondary | ICD-10-CM

## 2022-02-04 DIAGNOSIS — Z23 Encounter for immunization: Secondary | ICD-10-CM | POA: Diagnosis not present

## 2022-02-04 DIAGNOSIS — Z1329 Encounter for screening for other suspected endocrine disorder: Secondary | ICD-10-CM

## 2022-02-04 MED ORDER — TRAZODONE HCL 50 MG PO TABS: 25.0000 mg | ORAL_TABLET | Freq: Every evening | ORAL | 3 refills | Status: DC | PRN

## 2022-02-05 ENCOUNTER — Ambulatory Visit (INDEPENDENT_AMBULATORY_CARE_PROVIDER_SITE_OTHER): Payer: Self-pay

## 2022-02-05 DIAGNOSIS — E782 Mixed hyperlipidemia: Secondary | ICD-10-CM

## 2022-02-05 DIAGNOSIS — Z136 Encounter for screening for cardiovascular disorders: Secondary | ICD-10-CM

## 2022-02-06 ENCOUNTER — Encounter: Payer: Self-pay | Admitting: Family Medicine

## 2022-02-16 DIAGNOSIS — L570 Actinic keratosis: Secondary | ICD-10-CM | POA: Diagnosis not present

## 2022-02-16 DIAGNOSIS — Z129 Encounter for screening for malignant neoplasm, site unspecified: Secondary | ICD-10-CM | POA: Diagnosis not present

## 2022-02-18 ENCOUNTER — Encounter: Payer: Self-pay | Admitting: Family Medicine

## 2022-02-18 ENCOUNTER — Ambulatory Visit (INDEPENDENT_AMBULATORY_CARE_PROVIDER_SITE_OTHER): Payer: Medicare (Managed Care)

## 2022-02-18 DIAGNOSIS — M810 Age-related osteoporosis without current pathological fracture: Secondary | ICD-10-CM | POA: Diagnosis not present

## 2022-02-24 ENCOUNTER — Ambulatory Visit (AMBULATORY_SURGERY_CENTER): Payer: Self-pay

## 2022-02-24 VITALS — Ht 62.0 in | Wt 113.0 lb

## 2022-02-24 DIAGNOSIS — Z86010 Personal history of colonic polyps: Secondary | ICD-10-CM

## 2022-02-24 MED ORDER — PLENVU 140 G PO SOLR: 1.0000 | ORAL | Status: DC

## 2022-02-24 NOTE — Progress Notes (Signed)

## 2022-03-03 ENCOUNTER — Encounter: Payer: Self-pay | Admitting: Family Medicine

## 2022-03-03 DIAGNOSIS — E785 Hyperlipidemia, unspecified: Secondary | ICD-10-CM

## 2022-03-03 DIAGNOSIS — M810 Age-related osteoporosis without current pathological fracture: Secondary | ICD-10-CM

## 2022-03-03 DIAGNOSIS — G4700 Insomnia, unspecified: Secondary | ICD-10-CM

## 2022-03-03 MED ORDER — ROSUVASTATIN CALCIUM 20 MG PO TABS: 20.0000 mg | ORAL_TABLET | Freq: Every day | ORAL | 3 refills | Status: DC

## 2022-03-03 MED ORDER — TRAZODONE HCL 50 MG PO TABS: 25.0000 mg | ORAL_TABLET | Freq: Every evening | ORAL | 3 refills | Status: DC | PRN

## 2022-03-03 NOTE — Telephone Encounter (Signed)
Dr Lorelei PontFaythe Ghee to cancel Fosamax, Start Rosuvastatin- what dose? And send Trazodone?

## 2022-03-06 ENCOUNTER — Telehealth: Payer: Self-pay | Admitting: Family Medicine

## 2022-03-06 NOTE — Telephone Encounter (Signed)
Left message for patient to call back and schedule Medicare Annual Wellness Visit (AWV) either virtually or phone.   Left  my Herbie Drape number [phone]   Last AWV 02/24/21 please schedule with Nurse Health Adviser 2   45 min for awv-i and in office appointments 30 min for awv-s  phone/virtual appointments

## 2022-03-09 NOTE — Telephone Encounter (Signed)
Returned patients call  Patient returned my call 03/06/22

## 2022-03-10 NOTE — Telephone Encounter (Signed)
Returned patients call  Patient returned my call 03/09/22

## 2022-03-16 ENCOUNTER — Telehealth: Payer: Self-pay | Admitting: Gastroenterology

## 2022-03-16 DIAGNOSIS — Z86010 Personal history of colonic polyps: Secondary | ICD-10-CM

## 2022-03-16 MED ORDER — PLENVU 140 G PO SOLR: 1.0000 | Freq: Once | ORAL | 0 refills | Status: AC

## 2022-03-16 NOTE — Telephone Encounter (Signed)
Patient called states [facility] does not have her prep medication, please resend.

## 2022-03-20 NOTE — Telephone Encounter (Addendum)
Error

## 2022-03-24 ENCOUNTER — Encounter: Payer: Medicare (Managed Care) | Admitting: Gastroenterology

## 2022-05-01 ENCOUNTER — Telehealth: Payer: Self-pay | Admitting: Gastroenterology

## 2022-05-01 DIAGNOSIS — Z86010 Personal history of colonic polyps: Secondary | ICD-10-CM

## 2022-05-01 MED ORDER — NA SULFATE-K SULFATE-MG SULF 17.5-3.13-1.6 GM/177ML PO SOLN: 1.0000 | Freq: Once | ORAL | 0 refills | Status: AC

## 2022-05-01 NOTE — Telephone Encounter (Signed)
Spoke with pt- Suprep sent to pharmacy and new instructions sent to pt via [facility]

## 2022-05-01 NOTE — Telephone Encounter (Signed)
Patient is calling has questions regarding prep medication. Please advise

## 2022-05-25 ENCOUNTER — Telehealth: Payer: Self-pay | Admitting: Family Medicine

## 2022-05-25 DIAGNOSIS — H43813 Vitreous degeneration, bilateral: Secondary | ICD-10-CM | POA: Diagnosis not present

## 2022-05-25 DIAGNOSIS — D3132 Benign neoplasm of left choroid: Secondary | ICD-10-CM | POA: Diagnosis not present

## 2022-05-25 DIAGNOSIS — H35423 Microcystoid degeneration of retina, bilateral: Secondary | ICD-10-CM | POA: Diagnosis not present

## 2022-05-25 DIAGNOSIS — H35372 Puckering of macula, left eye: Secondary | ICD-10-CM | POA: Diagnosis not present

## 2022-05-25 NOTE — Telephone Encounter (Signed)
Copied from [place] [phone]. Topic: Medicare AWV >> May 25, 2022 11:46 AM Devoria Glassing wrote: Reason for CRM: Left message for patient to schedule Annual Wellness Visit(AWV).  Please schedule with Health Nurse Advisor at [facility]. Please call [phone] ask for [facility].

## 2022-05-26 ENCOUNTER — Encounter: Payer: Self-pay | Admitting: Gastroenterology

## 2022-06-05 ENCOUNTER — Encounter: Payer: Self-pay | Admitting: Gastroenterology

## 2022-06-05 ENCOUNTER — Ambulatory Visit (AMBULATORY_SURGERY_CENTER): Payer: Medicare HMO | Admitting: Gastroenterology

## 2022-06-05 VITALS — BP 107/63 | HR 59 | Temp 98.6°F | Resp 16 | Ht 62.0 in | Wt 113.0 lb

## 2022-06-05 DIAGNOSIS — Z09 Encounter for follow-up examination after completed treatment for conditions other than malignant neoplasm: Secondary | ICD-10-CM | POA: Diagnosis not present

## 2022-06-05 DIAGNOSIS — Z86010 Personal history of colonic polyps: Secondary | ICD-10-CM | POA: Diagnosis not present

## 2022-06-05 DIAGNOSIS — E785 Hyperlipidemia, unspecified: Secondary | ICD-10-CM | POA: Diagnosis not present

## 2022-06-05 MED ORDER — SODIUM CHLORIDE 0.9 % IV SOLN: 500.0000 mL | Freq: Once | INTRAVENOUS | Status: DC

## 2022-06-05 NOTE — Progress Notes (Signed)
Pt's states no medical or surgical changes since previsit or office visit. VS assessed by D.T 

## 2022-06-05 NOTE — Op Note (Addendum)
[place] Patient Name: Shannon West Procedure Date: 06/05/2022 10:02 AM MRN: [id number] Endoscopist: Justice Britain , MD, [id number] Age: 67 Referring MD:  Date of Birth: March 11, 1956 Gender: Female Account #: [id number] Procedure:                Colonoscopy Indications:              Surveillance: Personal history of adenomatous                            polyps on last colonoscopy > 5 years ago Medicines:                Monitored Anesthesia Care Procedure:                Pre-Anesthesia Assessment:                           - Prior to the procedure, a History and Physical                            was performed, and patient medications and                            allergies were reviewed. The patient's tolerance of                            previous anesthesia was also reviewed. The risks                            and benefits of the procedure and the sedation                            options and risks were discussed with the patient.                            All questions were answered, and informed consent                            was obtained. Prior Anticoagulants: The patient has                            taken no anticoagulant or antiplatelet agents                            except for aspirin. ASA Grade Assessment: II - A                            patient with mild systemic disease. After reviewing                            the risks and benefits, the patient was deemed in                            satisfactory condition to undergo the procedure.  After obtaining informed consent, the colonoscope                            was passed under direct vision. Throughout the                            procedure, the patient's blood pressure, pulse, and                            oxygen saturations were monitored continuously. The                            Olympus [id number]) Colonoscope was                             introduced through the anus and advanced to the 3                            cm into the ileum. The colonoscopy was somewhat                            difficult due to a redundant colon and significant                            looping. Successful completion of the procedure was                            aided by changing the patient's position, using                            manual pressure, straightening and shortening the                            scope to obtain bowel loop reduction and using                            scope torsion. The patient tolerated the procedure.                            The quality of the bowel preparation was adequate.                            The terminal ileum, ileocecal valve, appendiceal                            orifice, and rectum were photographed. Scope In: 10:08:44 AM Scope Out: 10:22:46 AM Scope Withdrawal Time: 0 hours 6 minutes 39 seconds  Total Procedure Duration: 0 hours 14 minutes 2 seconds  Findings:                 The digital rectal exam findings include                            hemorrhoids. Pertinent negatives include no  palpable rectal lesions.                           The colon (entire examined portion) was                            significantly redundant.                           The terminal ileum and ileocecal valve appeared                            normal.                           Normal mucosa was found in the entire colon                            otherwise.                           Non-bleeding non-thrombosed external and internal                            hemorrhoids were found during retroflexion, during                            perianal exam and during digital exam. The                            hemorrhoids were Grade II (internal hemorrhoids                            that prolapse but reduce spontaneously). Complications:            No immediate complications. Estimated  Blood Loss:     Estimated blood loss: none. Impression:               - Hemorrhoids found on digital rectal exam.                           - Redundant colon.                           - The examined portion of the ileum was normal.                           - Normal mucosa in the entire examined colon                            otherwise.                           - Non-bleeding non-thrombosed external and internal                            hemorrhoids. Recommendation:           - The patient will  be observed post-procedure,                            until all discharge criteria are met.                           - Discharge patient to home.                           - Patient has a contact number available for                            emergencies. The signs and symptoms of potential                            delayed complications were discussed with the                            patient. Return to normal activities tomorrow.                            Written discharge instructions were provided to the                            patient.                           - High fiber diet.                           - Use FiberCon 1-2 tablets PO daily.                           - Continue present medications.                           - Repeat colonoscopy in 7 years for surveillance                            due to history of previous adenomatous colon polyps.                           - During recovery, patient described an                            unintentional weight loss over the last year. She                            also has had some increased early satiety but no                            nausea or vomiting. Consideration of an Upper                            Endoscopy should be had an possible cross-sectional  imaging pending patients symptoms persisting. She                            would be OK with a clinic visit, and we will                             arrange that for her in the coming weeks to discuss                            things further.                           - The findings and recommendations were discussed                            with the patient.                           - The findings and recommendations were discussed                            with the designated responsible adult. Justice Britain, MD 06/05/2022 10:26:52 AM

## 2022-06-05 NOTE — Progress Notes (Unsigned)
Stable to recovery, report given to RN

## 2022-06-05 NOTE — Progress Notes (Signed)
GASTROENTEROLOGY PROCEDURE H&P NOTE   Primary Care Physician: Darreld Mclean, MD  HPI: Shannon West is a 67 y.o. female who presents for Colonoscopy for surveillance in setting of previous adenomatous polyps.  Past Medical History:  Diagnosis Date   Clotting disorder ([place])    Factor 5 Leiden mutation, heterozygous ([place])    Dr Beryle Beams, Hematology   GERD 09/07/2006   History of anemia    Hyperlipemia    Hypochromic-microcytic anemia 07/13/2011   Hb 10.9 MCV 83.5    Osteopenia    [facility]   Osteoporosis    Transaminitis 07/13/2011   Borderline  SGPT 38  06/18/11 normal SGOT   Vitamin D deficiency 2008   Past Surgical History:  Procedure Laterality Date   CARPAL TUNNEL RELEASE Right    RUE   COLONOSCOPY  2007   Negative; Dr Carlean Purl   UPPER GASTROINTESTINAL ENDOSCOPY  2007   hiatal hernia   Current Outpatient Medications  Medication Sig Dispense Refill   aspirin EC 81 MG tablet Take 81 mg by mouth daily.     calcium carbonate (OS-CAL) 600 MG TABS Take 600 mg by mouth daily.      cholecalciferol (VITAMIN D3) 25 MCG (1000 UNIT) tablet Take 1,000 Units by mouth daily.     Omega-3 Fatty Acids (FISH OIL) 1000 MG CAPS Take 1,000 mg by mouth daily.      rosuvastatin (CRESTOR) 20 MG tablet Take 1 tablet (20 mg total) by mouth daily. 90 tablet 3   traZODone (DESYREL) 50 MG tablet Take 0.5-1 tablets (25-50 mg total) by mouth at bedtime as needed for sleep. 90 tablet 3   No current facility-administered medications for this visit.    Current Outpatient Medications:    aspirin EC 81 MG tablet, Take 81 mg by mouth daily., Disp: , Rfl:    calcium carbonate (OS-CAL) 600 MG TABS, Take 600 mg by mouth daily. , Disp: , Rfl:    cholecalciferol (VITAMIN D3) 25 MCG (1000 UNIT) tablet, Take 1,000 Units by mouth daily., Disp: , Rfl:    Omega-3 Fatty Acids (FISH OIL) 1000 MG CAPS, Take 1,000 mg by mouth daily. , Disp: , Rfl:    rosuvastatin (CRESTOR) 20 MG tablet, Take 1  tablet (20 mg total) by mouth daily., Disp: 90 tablet, Rfl: 3   traZODone (DESYREL) 50 MG tablet, Take 0.5-1 tablets (25-50 mg total) by mouth at bedtime as needed for sleep., Disp: 90 tablet, Rfl: 3 No Known Allergies Family History  Problem Relation Age of Onset   Colon polyps Mother    Osteoporosis Mother    Hyperlipidemia Mother    Hypothyroidism Mother    CVA Mother 52   Hyperlipidemia Father    Aortic aneurysm Father        smoker   Diabetes Brother        died in sleep in context of pulmonary infection @ 65   Hyperlipidemia Brother    Clotting disorder Brother        factor 5 deficiency with DVT   Diabetes Maternal Grandmother    Heart attack Neg Hx    Colon cancer Neg Hx    Esophageal cancer Neg Hx    Rectal cancer Neg Hx    Stomach cancer Neg Hx    Social History   Socioeconomic History   Marital status: Married    Spouse name: Not on file   Number of children: Not on file   Years of education: Not  on file   Highest education level: Not on file  Occupational History   Not on file  Tobacco Use   Smoking status: Never   Smokeless tobacco: Never  Substance and Sexual Activity   Alcohol use: No    Alcohol/week: 0.0 standard drinks of alcohol    Comment: rarely   Drug use: No   Sexual activity: Yes    Partners: Male  Other Topics Concern   Not on file  Social History Narrative   Not on file   Social Determinants of Health   Financial Resource Strain: Low Risk  (02/24/2021)   Overall Financial Resource Strain (CARDIA)    Difficulty of Paying Living Expenses: Not hard at all  Food Insecurity: No Food Insecurity (02/24/2021)   Hunger Vital Sign    Worried About Running Out of Food in the Last Year: Never true    [place] in the Last Year: Never true  Transportation Needs: No Transportation Needs (02/24/2021)   PRAPARE - Hydrologist (Medical): No    Lack of Transportation (Non-Medical): No  Physical Activity: Inactive  (02/24/2021)   Exercise Vital Sign    Days of Exercise per Week: 0 days    Minutes of Exercise per Session: 0 min  Stress: No Stress Concern Present (02/24/2021)   [place]    Feeling of Stress : Not at all  Social Connections: [place] (02/24/2021)   Social Connection and Isolation Panel [NHANES]    Frequency of Communication with Friends and Family: More than three times a week    Frequency of Social Gatherings with Friends and Family: More than three times a week    Attends Religious Services: More than 4 times per year    Active Member of [employer] or Organizations: Yes    Attends Archivist Meetings: More than 4 times per year    Marital Status: Married  Human resources officer Violence: Not At Risk (02/24/2021)   Humiliation, Afraid, Rape, and Kick questionnaire    Fear of Current or Ex-Partner: No    Emotionally Abused: No    Physically Abused: No    Sexually Abused: No    Physical Exam: There were no vitals filed for this visit. There is no height or weight on file to calculate BMI. GEN: NAD EYE: Sclerae anicteric ENT: MMM CV: Non-tachycardic GI: Soft, NT/ND NEURO:  Alert & Oriented x 3  Lab Results: No results for input(s): "WBC", "HGB", "HCT", "PLT" in the last 72 hours. BMET No results for input(s): "NA", "K", "CL", "CO2", "GLUCOSE", "BUN", "CREATININE", "CALCIUM" in the last 72 hours. LFT No results for input(s): "PROT", "ALBUMIN", "AST", "ALT", "ALKPHOS", "BILITOT", "BILIDIR", "IBILI" in the last 72 hours. PT/INR No results for input(s): "LABPROT", "INR" in the last 72 hours.   Impression / Plan: This is a 67 y.o.female who presents for Colonoscopy for surveillance in setting of previous adenomatous polyps.  The risks and benefits of endoscopic evaluation/treatment were discussed with the patient and/or family; these include but are not limited to the risk of perforation, infection,  bleeding, missed lesions, lack of diagnosis, severe illness requiring hospitalization, as well as anesthesia and sedation related illnesses.  The patient's history has been reviewed, patient examined, no change in status, and deemed stable for procedure.  The patient and/or family is agreeable to proceed.    Justice Britain, MD [facility] Advanced [facility]oscopy Office # [id number]

## 2022-06-05 NOTE — Patient Instructions (Addendum)
Recommendation:           - The patient will be observed post-procedure,                            until all discharge criteria are met.                           - Discharge patient to home.                           - Patient has a contact number available for                            emergencies. The signs and symptoms of potential                            delayed complications were discussed with the                            patient. Return to normal activities tomorrow.                            Written discharge instructions were provided to the                            patient.                           - High fiber diet.                           - Use FiberCon 1-2 tablets PO daily.                           - Continue present medications.                           - Repeat colonoscopy in 7 years for surveillance                            due to history of previous adenomatous colon polyps.                           - The findings and recommendations were discussed                            with the patient.                           - The findings and recommendations were discussed                            with the designated responsible adult.     6 week clinic visit --call office to make appointment.  Handouts on high fiber diet and hemorrhoids given.  YOU HAD AN ENDOSCOPIC PROCEDURE TODAY AT THE [facility]:   Refer to the procedure report that was given to you for any specific questions about what was found during the examination.  If the procedure report does not answer your questions, please call your gastroenterologist to clarify.  If you requested that your care partner not be given the details of your procedure findings, then the procedure report has been included in a sealed envelope for you to review at your convenience later.  YOU SHOULD EXPECT: Some feelings of bloating in the abdomen. Passage of more gas than usual.  Walking can help get rid  of the air that was put into your GI tract during the procedure and reduce the bloating. If you had a lower endoscopy (such as a colonoscopy or flexible sigmoidoscopy) you may notice spotting of blood in your stool or on the toilet paper. If you underwent a bowel prep for your procedure, you may not have a normal bowel movement for a few days.  Please Note:  You might notice some irritation and congestion in your nose or some drainage.  This is from the oxygen used during your procedure.  There is no need for concern and it should clear up in a day or so.  SYMPTOMS TO REPORT IMMEDIATELY:  Following lower endoscopy (colonoscopy or flexible sigmoidoscopy):  Excessive amounts of blood in the stool  Significant tenderness or worsening of abdominal pains  Swelling of the abdomen that is new, acute  Fever of 100F or higher  For urgent or emergent issues, a gastroenterologist can be reached at any hour by calling [phone]. Do not use MyChart messaging for urgent concerns.   DIET:  We do recommend a small meal at first, but then you may proceed to your regular diet.  Drink plenty of fluids but you should avoid alcoholic beverages for 24 hours.  ACTIVITY:  You should plan to take it easy for the rest of today and you should NOT DRIVE or use heavy machinery until tomorrow (because of the sedation medicines used during the test).    FOLLOW UP: Our staff will call the number listed on your records the next business day following your procedure.  We will call around 7:15- 8:00 am to check on you and address any questions or concerns that you may have regarding the information given to you following your procedure. If we do not reach you, we will leave a message.     If any biopsies were taken you will be contacted by phone or by letter within the next 1-3 weeks.  Please call us at [phone] if you have not heard about the biopsies in 3 weeks.    SIGNATURES/CONFIDENTIALITY: You and/or your  care partner have signed paperwork which will be entered into your electronic medical record.  These signatures attest to the fact that that the information above on your After Visit Summary has been reviewed and is understood.  Full responsibility of the confidentiality of this discharge information lies with you and/or your care-partner.

## 2022-06-08 ENCOUNTER — Telehealth: Payer: Self-pay | Admitting: *Deleted

## 2022-06-08 ENCOUNTER — Other Ambulatory Visit: Payer: Self-pay | Admitting: Family Medicine

## 2022-06-08 NOTE — Telephone Encounter (Signed)
No answer on  follow up call. Left message.   

## 2022-07-13 ENCOUNTER — Telehealth: Payer: Self-pay | Admitting: Family Medicine

## 2022-07-13 NOTE — Telephone Encounter (Signed)
Contacted Shannon West to schedule their annual wellness visit. Patient declined to schedule AWV at this time. AWV scheduled with insurance company 07/24/22  [name]; Care Guide [facility] Direct Dial: [phone]

## 2022-07-24 DIAGNOSIS — Z823 Family history of stroke: Secondary | ICD-10-CM | POA: Diagnosis not present

## 2022-07-24 DIAGNOSIS — E785 Hyperlipidemia, unspecified: Secondary | ICD-10-CM | POA: Diagnosis not present

## 2022-07-24 DIAGNOSIS — M810 Age-related osteoporosis without current pathological fracture: Secondary | ICD-10-CM | POA: Diagnosis not present

## 2022-07-24 DIAGNOSIS — Z7983 Long term (current) use of bisphosphonates: Secondary | ICD-10-CM | POA: Diagnosis not present

## 2022-07-24 DIAGNOSIS — G4700 Insomnia, unspecified: Secondary | ICD-10-CM | POA: Diagnosis not present

## 2022-07-24 DIAGNOSIS — Z008 Encounter for other general examination: Secondary | ICD-10-CM | POA: Diagnosis not present

## 2022-07-24 DIAGNOSIS — Z8249 Family history of ischemic heart disease and other diseases of the circulatory system: Secondary | ICD-10-CM | POA: Diagnosis not present

## 2022-07-24 DIAGNOSIS — Z833 Family history of diabetes mellitus: Secondary | ICD-10-CM | POA: Diagnosis not present

## 2022-07-24 DIAGNOSIS — R32 Unspecified urinary incontinence: Secondary | ICD-10-CM | POA: Diagnosis not present

## 2022-09-08 ENCOUNTER — Encounter: Payer: Self-pay | Admitting: Family Medicine

## 2022-09-10 ENCOUNTER — Telehealth: Payer: Self-pay

## 2022-09-10 ENCOUNTER — Other Ambulatory Visit: Payer: Self-pay

## 2022-09-10 DIAGNOSIS — G4700 Insomnia, unspecified: Secondary | ICD-10-CM

## 2022-09-10 DIAGNOSIS — E782 Mixed hyperlipidemia: Secondary | ICD-10-CM

## 2022-09-10 MED ORDER — ROSUVASTATIN CALCIUM 20 MG PO TABS: 20.0000 mg | ORAL_TABLET | Freq: Every day | ORAL | 0 refills | Status: DC

## 2022-09-10 MED ORDER — TRAZODONE HCL 50 MG PO TABS: 25.0000 mg | ORAL_TABLET | Freq: Every evening | ORAL | 0 refills | Status: DC | PRN

## 2022-09-10 NOTE — Telephone Encounter (Signed)
Received a fax from [facility] to refill Rosuvastatin 20 and Trazadone 50.  After reviewing med list I see Pravastatin 20 listed as well. Will confirm medication with the pt.

## 2022-09-10 NOTE — Telephone Encounter (Signed)
Confirmed with the pt that she is not taking the Pravastatin that is on her list and that she is on the Crestor. Refills have been sent to [facility].

## 2022-09-21 NOTE — Progress Notes (Unsigned)
[facility] at [facility] [address], [address] [place], [place] [address] [phone]  Date:  09/23/2022   Name:  Shannon West   DOB:  01-29-1956   MRN:  [id number]  PCP:  Pearline Cables, MD    Chief Complaint: No chief complaint on file.   History of Present Illness:  Shannon West is a 67 y.o. very pleasant female patient who presents with the following:  Patient seen today for periodic follow-up- History of prediabetes, hyperlipidemia, osteoporosis, factor V Leiden carrier/microcytic anemia  Most recent visit with myself was in October of last year At that time Shannon West was under a fair amount of stream, she was trying to care for her 63 year old mother with dementia.  We started her on some trazodone for sleep  Aspirin 81 Fish oil Crestor Trazodone  We had a coronary calcium last fall, she scored in the 47th percentile for age Bone density 11/23-T-score -2.5 Her bone density had not improved as hoped for, she is interested in switching over from Fosamax to Prolia  Complete labs done in October  Patient Active Problem List   Diagnosis Date Noted   Dermatitis due to unknown cause 01/23/2021   Pre-diabetes 06/03/2017   Hypochromic-microcytic anemia 07/13/2011   Transaminitis 07/13/2011   Factor 5 Leiden mutation, heterozygous (HCC) 06/23/2011   VITAMIN D DEFICIENCY 03/24/2010   HYPERLIPIDEMIA 03/03/2007   GERD 09/07/2006   Osteoporosis 09/07/2006    Past Medical History:  Diagnosis Date   Clotting disorder (HCC)    Factor 5 Leiden mutation, heterozygous (HCC)    Dr Cyndie Chime, Hematology   GERD 09/07/2006   History of anemia    Hyperlipemia    Hypochromic-microcytic anemia 07/13/2011   Hb 10.9 MCV 83.5    Osteopenia    [facility]   Osteoporosis    Transaminitis 07/13/2011   Borderline  SGPT 38  06/18/11 normal SGOT   Vitamin D deficiency 2008    Past Surgical History:  Procedure Laterality Date    CARPAL TUNNEL RELEASE Right    RUE   COLONOSCOPY  2007   Negative; Dr Leone Payor   UPPER GASTROINTESTINAL ENDOSCOPY  2007   hiatal hernia    Social History   Tobacco Use   Smoking status: Never   Smokeless tobacco: Never  Substance Use Topics   Alcohol use: No    Alcohol/week: 0.0 standard drinks of alcohol    Comment: rarely   Drug use: No    Family History  Problem Relation Age of Onset   Colon polyps Mother    Osteoporosis Mother    Hyperlipidemia Mother    Hypothyroidism Mother    CVA Mother 32   Hyperlipidemia Father    Aortic aneurysm Father        smoker   Diabetes Brother        died in sleep in context of pulmonary infection @ 73   Hyperlipidemia Brother    Clotting disorder Brother        factor 5 deficiency with DVT   Diabetes Maternal Grandmother    Heart attack Neg Hx    Colon cancer Neg Hx    Esophageal cancer Neg Hx    Rectal cancer Neg Hx    Stomach cancer Neg Hx     No Known Allergies  Medication list has been reviewed and updated.  Current Outpatient Medications on File Prior to Visit  Medication Sig Dispense Refill  alendronate (FOSAMAX) 70 MG tablet TAKE 1 TABLET WEEKLY WITH 8 OUNCES OF PLAIN WATER 30 MINUTES BEFORE FIRST FOOD, DRINK OR MEDICATIONS. STAY UPRIGHT FOR 30 MINUTES 12 tablet 3   aspirin EC 81 MG tablet Take 81 mg by mouth daily.     calcium carbonate (OS-CAL) 600 MG TABS Take 600 mg by mouth daily.      cholecalciferol (VITAMIN D3) 25 MCG (1000 UNIT) tablet Take 1,000 Units by mouth daily.     Omega-3 Fatty Acids (FISH OIL) 1000 MG CAPS Take 1,000 mg by mouth daily.      rosuvastatin (CRESTOR) 20 MG tablet Take 1 tablet (20 mg total) by mouth daily. 90 tablet 0   traZODone (DESYREL) 50 MG tablet Take 0.5-1 tablets (25-50 mg total) by mouth at bedtime as needed for sleep. 90 tablet 0   No current facility-administered medications on file prior to visit.    Review of Systems:  As per HPI- otherwise negative.   Physical  Examination: There were no vitals filed for this visit. There were no vitals filed for this visit. There is no height or weight on file to calculate BMI. Ideal Body Weight:    GEN: no acute distress. HEENT: Atraumatic, Normocephalic.  Ears and Nose: No external deformity. CV: RRR, No M/G/R. No JVD. No thrill. No extra heart sounds. PULM: CTA B, no wheezes, crackles, rhonchi. No retractions. No resp. distress. No accessory muscle use. ABD: S, NT, ND, +BS. No rebound. No HSM. EXTR: No c/c/e PSYCH: Normally interactive. Conversant.    Assessment and Plan: ***  Signed Abbe Amsterdam, MD

## 2022-09-21 NOTE — Patient Instructions (Incomplete)
It was good to see you again today, I will be in touch with your labs soon as possible Assuming all is well please see me in about 6 months Work on gaining some weight back!  If you are not able to gain or if you continue losing please alert me as we will want to look further

## 2022-09-23 ENCOUNTER — Ambulatory Visit (INDEPENDENT_AMBULATORY_CARE_PROVIDER_SITE_OTHER): Payer: Medicare HMO | Admitting: Family Medicine

## 2022-09-23 ENCOUNTER — Telehealth: Payer: Self-pay

## 2022-09-23 VITALS — BP 118/60 | HR 67 | Temp 98.6°F | Resp 18 | Ht 62.0 in | Wt 106.8 lb

## 2022-09-23 DIAGNOSIS — E785 Hyperlipidemia, unspecified: Secondary | ICD-10-CM | POA: Diagnosis not present

## 2022-09-23 DIAGNOSIS — M810 Age-related osteoporosis without current pathological fracture: Secondary | ICD-10-CM | POA: Diagnosis not present

## 2022-09-23 DIAGNOSIS — R7303 Prediabetes: Secondary | ICD-10-CM

## 2022-09-23 DIAGNOSIS — D6851 Activated protein C resistance: Secondary | ICD-10-CM

## 2022-09-23 DIAGNOSIS — D649 Anemia, unspecified: Secondary | ICD-10-CM

## 2022-09-23 DIAGNOSIS — Z5181 Encounter for therapeutic drug level monitoring: Secondary | ICD-10-CM

## 2022-09-23 DIAGNOSIS — R634 Abnormal weight loss: Secondary | ICD-10-CM

## 2022-09-23 NOTE — Telephone Encounter (Signed)
Dr Patsy Lager would like to start the pt on Prolia. She has tried Fosamax

## 2022-09-24 ENCOUNTER — Encounter: Payer: Self-pay | Admitting: Family Medicine

## 2022-09-24 LAB — BASIC METABOLIC PANEL
BUN: 18 mg/dL (ref 6–23)
CO2: 29 mEq/L (ref 19–32)
Calcium: 9.2 mg/dL (ref 8.4–10.5)
Chloride: 103 mEq/L (ref 96–112)
Creatinine, Ser: 0.78 mg/dL (ref 0.40–1.20)
GFR: 78.37 mL/min (ref >60.00)
Glucose, Bld: 98 mg/dL (ref 70–99)
Potassium: 4.2 mEq/L (ref 3.5–5.1)
Sodium: 140 mEq/L (ref 135–145)

## 2022-09-24 LAB — CBC
HCT: 36.8 % (ref 36.0–46.0)
Hemoglobin: 11.8 g/dL — ABNORMAL LOW (ref 12.0–15.0)
MCHC: 32.2 g/dL (ref 30.0–36.0)
MCV: 83.6 fl (ref 78.0–100.0)
Platelets: 193 10*3/uL (ref 150.0–400.0)
RBC: 4.4 Mil/uL (ref 3.87–5.11)
RDW: 17.2 % — ABNORMAL HIGH (ref 11.5–15.5)
WBC: 7.2 10*3/uL (ref 4.0–10.5)

## 2022-09-24 LAB — TSH: TSH: 2.36 u[IU]/mL (ref 0.35–5.50)

## 2022-09-24 LAB — HEMOGLOBIN A1C: Hgb A1c MFr Bld: 6 % (ref 4.6–6.5)

## 2022-09-24 NOTE — Addendum Note (Signed)
Addended by: Abbe Amsterdam C on: 09/24/2022 08:24 PM   Modules accepted: Orders

## 2022-09-28 ENCOUNTER — Telehealth: Payer: Self-pay

## 2022-09-28 NOTE — Telephone Encounter (Signed)
Created new encounter for Prolia BIV. Will route encounter back once benefit verification is complete.  

## 2022-09-28 NOTE — Telephone Encounter (Signed)
PA submitted via [employer]  Authorization Number : [id number]  Status pending

## 2022-09-28 NOTE — Telephone Encounter (Signed)
Prolia VOB initiated via [email or web address] 

## 2022-09-29 ENCOUNTER — Other Ambulatory Visit (HOSPITAL_COMMUNITY): Payer: Self-pay

## 2022-09-29 NOTE — Telephone Encounter (Signed)
Pt ready for scheduling for PROLIA on or after : 09/29/22  Out-of-pocket cost due at time of visit: $327  Primary: [employer] Prolia co-insurance: 20% Admin fee co-insurance: 20%  Secondary: --- Prolia co-insurance:  Admin fee co-insurance:   Medical Benefit Details: Date Benefits were checked: 09/28/22 Deductible: NO/ Coinsurance: 20%/ Admin Fee: 20%  Prior Auth: APPROVED PA# [id number] Expiration Date: 09/28/22-09/28/23   Pharmacy benefit: Copay $100 If patient wants fill through the pharmacy benefit please send prescription to: [employer], and include estimated need by date in rx notes. Pharmacy will ship medication directly to the office.  Patient NOT eligible for Prolia Copay Card. Copay Card can make patient's cost as little as $25. Link to apply: https://www.[email or web address].com/copay  ** This summary of benefits is an estimation of the patient's out-of-pocket cost. Exact cost may very based on individual plan coverage.

## 2022-09-29 NOTE — Telephone Encounter (Signed)
Mychart message sent.

## 2022-10-06 ENCOUNTER — Encounter (INDEPENDENT_AMBULATORY_CARE_PROVIDER_SITE_OTHER): Payer: Medicare HMO | Admitting: Family Medicine

## 2022-10-06 DIAGNOSIS — F411 Generalized anxiety disorder: Secondary | ICD-10-CM

## 2022-10-07 MED ORDER — FLUOXETINE HCL 20 MG PO CAPS: 20.0000 mg | ORAL_CAPSULE | Freq: Every day | ORAL | 3 refills | Status: DC

## 2022-10-07 NOTE — Addendum Note (Signed)
Addended by: Abbe Amsterdam C on: 10/07/2022 04:46 PM   Modules accepted: Orders

## 2022-10-07 NOTE — Telephone Encounter (Signed)
Please see the MyChart message reply(ies) for my assessment and plan.  The patient gave consent for this Medical Advice Message and is aware that it may result in a bill to their insurance company as well as the possibility that this may result in a co-payment or deductible. They are an established patient, but are not seeking medical advice exclusively about a problem treated during an in person or video visit in the last 7 days. I did not recommend an in person or video visit within 7 days of my reply.  I spent a total of 15 minutes cumulative time within 7 days through MyChart messaging Cheryel Kyte, MD  

## 2022-10-07 NOTE — Telephone Encounter (Signed)
Pt will get from pharmacy.  Advised pt to be on lookout for phone call from pharmacy to pay copay and give consent.

## 2022-10-08 MED ORDER — DENOSUMAB 60 MG/ML ~~LOC~~ SOSY: PREFILLED_SYRINGE | SUBCUTANEOUS | 0 refills | Status: DC

## 2022-10-08 NOTE — Addendum Note (Signed)
Addended by: Thelma Barge D on: 10/08/2022 11:56 AM   Modules accepted: Orders

## 2022-10-13 NOTE — Telephone Encounter (Signed)
Yes pt is scheduled.

## 2022-10-13 NOTE — Telephone Encounter (Signed)
Did you speak to the pt?

## 2022-10-19 NOTE — Telephone Encounter (Signed)
Rx was sent to:   Will call to f/u: [facility] - [address] [place], [place] [address] Phone: [phone]  Fax: [phone] DEA #: [id number]

## 2022-10-27 ENCOUNTER — Telehealth: Payer: Self-pay | Admitting: *Deleted

## 2022-10-27 NOTE — Telephone Encounter (Signed)
[facility] called about delivery of Prolia.  It will be her on 10/29/22.

## 2022-10-29 NOTE — Telephone Encounter (Signed)
Prolia received and placed in fridge for NV.

## 2022-11-02 NOTE — Progress Notes (Addendum)
Shannon West is a 67 y.o. female presents to the office today for Prolia injections, per physician's orders. Prolia 60 mg SQ , was administered L arm today. Patient tolerated injection. Patient next injection due: 6 months, appt made No- will schedule in 5 months after benifits are ran again Initial injection YES Patient supplied: YES   DOD 11/03/22: Y Lowne Chase   Creft, Feliberto Harts

## 2022-11-03 ENCOUNTER — Other Ambulatory Visit (INDEPENDENT_AMBULATORY_CARE_PROVIDER_SITE_OTHER): Payer: Medicare HMO

## 2022-11-03 ENCOUNTER — Ambulatory Visit: Payer: Medicare HMO

## 2022-11-03 DIAGNOSIS — D649 Anemia, unspecified: Secondary | ICD-10-CM | POA: Diagnosis not present

## 2022-11-03 DIAGNOSIS — M810 Age-related osteoporosis without current pathological fracture: Secondary | ICD-10-CM | POA: Diagnosis not present

## 2022-11-03 LAB — CBC
HCT: 39.2 % (ref 36.0–46.0)
Hemoglobin: 12.7 g/dL (ref 12.0–15.0)
MCHC: 32.4 g/dL (ref 30.0–36.0)
MCV: 84.4 fl (ref 78.0–100.0)
Platelets: 263 10*3/uL (ref 150.0–400.0)
RBC: 4.65 Mil/uL (ref 3.87–5.11)
RDW: 18.3 % — ABNORMAL HIGH (ref 11.5–15.5)
WBC: 8.1 10*3/uL (ref 4.0–10.5)

## 2022-11-03 MED ORDER — DENOSUMAB 60 MG/ML ~~LOC~~ SOSY
60.0000 mg | PREFILLED_SYRINGE | Freq: Once | SUBCUTANEOUS | Status: AC
  Administered 2022-11-03: 60 mg via SUBCUTANEOUS

## 2022-11-04 ENCOUNTER — Encounter: Payer: Self-pay | Admitting: Family Medicine

## 2022-11-20 ENCOUNTER — Other Ambulatory Visit: Payer: Self-pay | Admitting: Family Medicine

## 2022-11-20 DIAGNOSIS — Z1231 Encounter for screening mammogram for malignant neoplasm of breast: Secondary | ICD-10-CM

## 2022-12-14 ENCOUNTER — Encounter: Payer: Self-pay | Admitting: Family Medicine

## 2022-12-14 ENCOUNTER — Other Ambulatory Visit: Payer: Self-pay | Admitting: Family Medicine

## 2022-12-14 DIAGNOSIS — E782 Mixed hyperlipidemia: Secondary | ICD-10-CM

## 2022-12-14 DIAGNOSIS — F411 Generalized anxiety disorder: Secondary | ICD-10-CM

## 2022-12-14 DIAGNOSIS — G4700 Insomnia, unspecified: Secondary | ICD-10-CM

## 2022-12-14 MED ORDER — FLUOXETINE HCL 20 MG PO CAPS
20.0000 mg | ORAL_CAPSULE | Freq: Every day | ORAL | 1 refills | Status: DC
Start: 2022-12-14 — End: 2023-02-18

## 2022-12-23 ENCOUNTER — Ambulatory Visit (INDEPENDENT_AMBULATORY_CARE_PROVIDER_SITE_OTHER): Payer: Medicare HMO

## 2022-12-23 DIAGNOSIS — Z1231 Encounter for screening mammogram for malignant neoplasm of breast: Secondary | ICD-10-CM | POA: Diagnosis not present

## 2023-02-14 NOTE — Progress Notes (Unsigned)
[facility] at [facility] [address] [place], [place] [address] [phone]  Date:  02/18/2023   Name:  Shannon West   DOB:  1955-04-21   MRN:  [id number]  PCP:  Pearline Cables, MD    Chief Complaint: No chief complaint on file.   History of Present Illness:  Shannon West is a 67 y.o. very pleasant female patient who presents with the following:  Patient seen today for physical exam Most recent visit with myself was in June History of prediabetes, hyperlipidemia, osteoporosis, factor V Leiden carrier/microcytic anemia  As of last visit her 69 year old mother had moved in with them and [place] was her primary caretaker She had lost several pounds-was not trying to lose, but noted she had been eating less due to taking care of her mother.  I encouraged her to work on gaining this weight back, will check on her weight today  Flu vaccine COVID booster Mammogram up-to-date Most recent DEXA in November Colonoscopy and Pap are up-to-date  She is now using Prolia Fluoxetine, trazodone at bedtime Crestor Baby aspirin Patient Active Problem List   Diagnosis Date Noted   Dermatitis due to unknown cause 01/23/2021   Pre-diabetes 06/03/2017   Hypochromic-microcytic anemia 07/13/2011   Transaminitis 07/13/2011   Factor 5 Leiden mutation, heterozygous (HCC) 06/23/2011   VITAMIN D DEFICIENCY 03/24/2010   HYPERLIPIDEMIA 03/03/2007   GERD 09/07/2006   Osteoporosis 09/07/2006    Past Medical History:  Diagnosis Date   Clotting disorder (HCC)    Factor 5 Leiden mutation, heterozygous (HCC)    Dr Cyndie Chime, Hematology   GERD 09/07/2006   History of anemia    Hyperlipemia    Hypochromic-microcytic anemia 07/13/2011   Hb 10.9 MCV 83.5    Osteopenia    [facility]   Osteoporosis    Transaminitis 07/13/2011   Borderline  SGPT 38  06/18/11 normal SGOT   Vitamin D deficiency 2008    Past Surgical History:   Procedure Laterality Date   CARPAL TUNNEL RELEASE Right    RUE   COLONOSCOPY  2007   Negative; Dr Leone Payor   UPPER GASTROINTESTINAL ENDOSCOPY  2007   hiatal hernia    Social History   Tobacco Use   Smoking status: Never   Smokeless tobacco: Never  Substance Use Topics   Alcohol use: No    Alcohol/week: 0.0 standard drinks of alcohol    Comment: rarely   Drug use: No    Family History  Problem Relation Age of Onset   Colon polyps Mother    Osteoporosis Mother    Hyperlipidemia Mother    Hypothyroidism Mother    CVA Mother 21   Hyperlipidemia Father    Aortic aneurysm Father        smoker   Diabetes Brother        died in sleep in context of pulmonary infection @ 72   Hyperlipidemia Brother    Clotting disorder Brother        factor 5 deficiency with DVT   Diabetes Maternal Grandmother    Heart attack Neg Hx    Colon cancer Neg Hx    Esophageal cancer Neg Hx    Rectal cancer Neg Hx    Stomach cancer Neg Hx     No Known Allergies  Medication list has been reviewed and updated.  Current Outpatient Medications on File Prior to Visit  Medication Sig Dispense Refill  aspirin EC 81 MG tablet Take 81 mg by mouth daily.     calcium carbonate (OS-CAL) 600 MG TABS Take 600 mg by mouth daily.      cholecalciferol (VITAMIN D3) 25 MCG (1000 UNIT) tablet Take 1,000 Units by mouth daily.     denosumab (PROLIA) 60 MG/ML SOSY injection TO BE GIVEN IN OFFICE.  PT HAS APPT 11/03/22.  DX CODE: M81.0 1 mL 0   FLUoxetine (PROZAC) 20 MG capsule Take 1 capsule (20 mg total) by mouth daily. 90 capsule 1   Omega-3 Fatty Acids (FISH OIL) 1000 MG CAPS Take 1,000 mg by mouth daily.      rosuvastatin (CRESTOR) 20 MG tablet Take 1 tablet (20 mg total) by mouth daily. 90 tablet 1   traZODone (DESYREL) 50 MG tablet Take 0.5-1 tablets (25-50 mg total) by mouth at bedtime as needed for sleep. 90 tablet 1   No current facility-administered medications on file prior to visit.    Review of  Systems:  As per HPI- otherwise negative.   Physical Examination: There were no vitals filed for this visit. There were no vitals filed for this visit. There is no height or weight on file to calculate BMI. Ideal Body Weight:    GEN: no acute distress. HEENT: Atraumatic, Normocephalic.  Ears and Nose: No external deformity. CV: RRR, No M/G/R. No JVD. No thrill. No extra heart sounds. PULM: CTA B, no wheezes, crackles, rhonchi. No retractions. No resp. distress. No accessory muscle use. ABD: S, NT, ND, +BS. No rebound. No HSM. EXTR: No c/c/e PSYCH: Normally interactive. Conversant.    Assessment and Plan: *** Physical exam today.  Encouraged healthy diet and exercise routine Signed Abbe Amsterdam, MD

## 2023-02-14 NOTE — Patient Instructions (Signed)
It was great to see you again today!  

## 2023-02-17 ENCOUNTER — Encounter: Payer: Medicare HMO | Admitting: Family Medicine

## 2023-02-18 ENCOUNTER — Ambulatory Visit: Payer: Medicare HMO | Admitting: Family Medicine

## 2023-02-18 ENCOUNTER — Encounter: Payer: Self-pay | Admitting: Family Medicine

## 2023-02-18 VITALS — BP 126/68 | HR 64 | Resp 18 | Ht 62.0 in | Wt 113.2 lb

## 2023-02-18 DIAGNOSIS — D649 Anemia, unspecified: Secondary | ICD-10-CM

## 2023-02-18 DIAGNOSIS — M810 Age-related osteoporosis without current pathological fracture: Secondary | ICD-10-CM | POA: Diagnosis not present

## 2023-02-18 DIAGNOSIS — D6851 Activated protein C resistance: Secondary | ICD-10-CM

## 2023-02-18 DIAGNOSIS — G4700 Insomnia, unspecified: Secondary | ICD-10-CM

## 2023-02-18 DIAGNOSIS — R7303 Prediabetes: Secondary | ICD-10-CM | POA: Diagnosis not present

## 2023-02-18 DIAGNOSIS — E785 Hyperlipidemia, unspecified: Secondary | ICD-10-CM

## 2023-02-18 DIAGNOSIS — Z0000 Encounter for general adult medical examination without abnormal findings: Secondary | ICD-10-CM | POA: Diagnosis not present

## 2023-02-18 DIAGNOSIS — F411 Generalized anxiety disorder: Secondary | ICD-10-CM

## 2023-02-18 DIAGNOSIS — E782 Mixed hyperlipidemia: Secondary | ICD-10-CM

## 2023-02-18 LAB — COMPREHENSIVE METABOLIC PANEL
ALT: 28 U/L (ref 0–35)
AST: 29 U/L (ref 0–37)
Albumin: 4.1 g/dL (ref 3.5–5.2)
Alkaline Phosphatase: 52 U/L (ref 39–117)
BUN: 16 mg/dL (ref 6–23)
CO2: 31 meq/L (ref 19–32)
Calcium: 9.3 mg/dL (ref 8.4–10.5)
Chloride: 103 meq/L (ref 96–112)
Creatinine, Ser: 0.69 mg/dL (ref 0.40–1.20)
GFR: 89.3 mL/min (ref >60.00)
Glucose, Bld: 82 mg/dL (ref 70–99)
Potassium: 4.4 meq/L (ref 3.5–5.1)
Sodium: 140 meq/L (ref 135–145)
Total Bilirubin: 0.5 mg/dL (ref 0.2–1.2)
Total Protein: 6.7 g/dL (ref 6.0–8.3)

## 2023-02-18 LAB — LIPID PANEL
Cholesterol: 118 mg/dL (ref 0–200)
HDL: 53.6 mg/dL (ref >39.00)
LDL Cholesterol: 53 mg/dL (ref 0–99)
NonHDL: 63.97
Total CHOL/HDL Ratio: 2
Triglycerides: 53 mg/dL (ref 0.0–149.0)
VLDL: 10.6 mg/dL (ref 0.0–40.0)

## 2023-02-18 LAB — HEMOGLOBIN A1C: Hgb A1c MFr Bld: 6.1 % (ref 4.6–6.5)

## 2023-02-18 MED ORDER — FLUOXETINE HCL 20 MG PO CAPS
20.0000 mg | ORAL_CAPSULE | Freq: Every day | ORAL | 3 refills | Status: DC
Start: 2023-02-18 — End: 2023-05-25

## 2023-02-18 MED ORDER — ROSUVASTATIN CALCIUM 20 MG PO TABS: 20.0000 mg | ORAL_TABLET | Freq: Every day | ORAL | 3 refills | Status: DC

## 2023-02-18 MED ORDER — TRAZODONE HCL 50 MG PO TABS: 25.0000 mg | ORAL_TABLET | Freq: Every evening | ORAL | 3 refills | Status: DC | PRN

## 2023-03-01 ENCOUNTER — Telehealth: Payer: Self-pay | Admitting: Family Medicine

## 2023-03-01 NOTE — Telephone Encounter (Signed)
Copied from CRM [id number]. Topic: Medicare AWV >> Mar 01, 2023  2:25 PM Payton Doughty wrote: Reason for CRM: Called LVM 03/01/2023 to schedule Annual Wellness Visit  Verlee Rossetti; Care Guide Amb[facility] Clinical Support [facility] Direct Dial: [phone]

## 2023-03-14 DIAGNOSIS — R69 Illness, unspecified: Secondary | ICD-10-CM | POA: Diagnosis not present

## 2023-04-16 ENCOUNTER — Telehealth: Payer: Self-pay

## 2023-04-16 DIAGNOSIS — M810 Age-related osteoporosis without current pathological fracture: Secondary | ICD-10-CM

## 2023-04-16 MED ORDER — DENOSUMAB 60 MG/ML ~~LOC~~ SOSY
60.0000 mg | PREFILLED_SYRINGE | Freq: Once | SUBCUTANEOUS | Status: AC
  Administered 2023-06-17: 60 mg via SUBCUTANEOUS

## 2023-04-16 NOTE — Telephone Encounter (Signed)
Prolia was done 11/03/22 Next is due 05/06/23 CAM placed. Please check PA/Cost.

## 2023-04-22 ENCOUNTER — Telehealth: Payer: Self-pay

## 2023-04-22 ENCOUNTER — Other Ambulatory Visit (HOSPITAL_COMMUNITY): Payer: Self-pay

## 2023-04-22 DIAGNOSIS — M810 Age-related osteoporosis without current pathological fracture: Secondary | ICD-10-CM

## 2023-04-22 NOTE — Telephone Encounter (Signed)
 Marland Kitchen

## 2023-04-22 NOTE — Telephone Encounter (Signed)
 Prolia VOB initiated via [email or web address]  Next Prolia inj DUE:  04/2023

## 2023-04-22 NOTE — Telephone Encounter (Signed)
 Pharmacy Patient Advocate Encounter   Received notification from  [employer] Portal that prior authorization for PROLIA  is required/requested.   Insurance verification completed.   The patient is insured through [employer] .   Per test claim: PA required; PA submitted to above mentioned insurance via Prompt PA Key/confirmation #/[id number] Status is pending

## 2023-04-23 NOTE — Telephone Encounter (Signed)
 PA is pending- see duplicate TE.

## 2023-04-26 ENCOUNTER — Other Ambulatory Visit (HOSPITAL_COMMUNITY): Payer: Self-pay

## 2023-05-12 ENCOUNTER — Other Ambulatory Visit (HOSPITAL_COMMUNITY): Payer: Self-pay

## 2023-05-12 NOTE — Telephone Encounter (Signed)
Placed a call to [phone] to check the PA of Prolia.  Per the representative, needing to fax over supporting chart notes.   Fax# [phone]  Case ID: [id number]

## 2023-05-17 ENCOUNTER — Other Ambulatory Visit (HOSPITAL_COMMUNITY): Payer: Self-pay

## 2023-05-17 NOTE — Telephone Encounter (Signed)
Pt ready for scheduling for PROLIA on or after : 05/17/23  Out-of-pocket cost due at time of visit: $357  Number of injection/visits approved: 2  Primary: [employer] Prolia co-insurance: 20% Admin fee co-insurance: 20%  Secondary: --- Prolia co-insurance:  Admin fee co-insurance:   Medical Benefit Details: Date Benefits were checked: 04/22/23 Deductible: NO/ Coinsurance: 20%/ Admin Fee: 20%  Prior Auth: APPROVED PA# [id number]  Expiration Date: 05/12/23-05/10/24  # of doses approved:2  Pharmacy benefit: Copay $300 If patient wants fill through the pharmacy benefit please send prescription to: [employer], and include estimated need by date in rx notes. Pharmacy will ship medication directly to the office.  Patient NOT eligible for Prolia Copay Card. Copay Card can make patient's cost as little as $25. Link to apply: [email or web address]://www.[email or web address]  ** This summary of benefits is an estimation of the patient's out-of-pocket cost. Exact cost may very based on individual plan coverage.

## 2023-05-17 NOTE — Telephone Encounter (Signed)
Pharmacy Patient Advocate Encounter  Received notification from [employer] that Prior Authorization for Prolia has been APPROVED from 05/12/23 to 05/10/24   Approval letter indexed to media tab.

## 2023-05-18 ENCOUNTER — Encounter: Payer: Self-pay | Admitting: *Deleted

## 2023-05-18 DIAGNOSIS — Z5181 Encounter for therapeutic drug level monitoring: Secondary | ICD-10-CM

## 2023-05-18 NOTE — Telephone Encounter (Addendum)
Left message for patient to call back and mychart message sent.  Pt is due at anytime.

## 2023-05-24 ENCOUNTER — Other Ambulatory Visit: Payer: Self-pay | Admitting: Family Medicine

## 2023-05-24 DIAGNOSIS — G4700 Insomnia, unspecified: Secondary | ICD-10-CM

## 2023-05-24 DIAGNOSIS — E782 Mixed hyperlipidemia: Secondary | ICD-10-CM

## 2023-05-25 ENCOUNTER — Ambulatory Visit (INDEPENDENT_AMBULATORY_CARE_PROVIDER_SITE_OTHER): Payer: Medicare (Managed Care) | Admitting: Family

## 2023-05-25 ENCOUNTER — Encounter: Payer: Self-pay | Admitting: Family Medicine

## 2023-05-25 ENCOUNTER — Other Ambulatory Visit: Payer: Self-pay

## 2023-05-25 VITALS — BP 108/66 | HR 50 | Ht 62.0 in | Wt 112.8 lb

## 2023-05-25 DIAGNOSIS — R300 Dysuria: Secondary | ICD-10-CM

## 2023-05-25 DIAGNOSIS — R319 Hematuria, unspecified: Secondary | ICD-10-CM | POA: Diagnosis not present

## 2023-05-25 DIAGNOSIS — F411 Generalized anxiety disorder: Secondary | ICD-10-CM

## 2023-05-25 LAB — POCT URINALYSIS DIPSTICK
Bilirubin, UA: NEGATIVE
Glucose, UA: NEGATIVE
Ketones, UA: NEGATIVE
Nitrite, UA: NEGATIVE
Protein, UA: NEGATIVE
Spec Grav, UA: <=1.005 — AB (ref 1.010–1.025)
Urobilinogen, UA: 0.2 U/dL
pH, UA: 6 (ref 5.0–8.0)

## 2023-05-25 MED ORDER — FLUOXETINE HCL 20 MG PO CAPS: 20.0000 mg | ORAL_CAPSULE | Freq: Every day | ORAL | 3 refills | Status: DC

## 2023-05-25 MED ORDER — SULFAMETHOXAZOLE-TRIMETHOPRIM 800-160 MG PO TABS: 1.0000 | ORAL_TABLET | Freq: Two times a day (BID) | ORAL | 0 refills | Status: AC

## 2023-05-25 NOTE — Telephone Encounter (Signed)
 Patient would like to see if there is any other medications she can take.  The cost is a little steep.  Her copay from pharmacy last year was $100 and with her new ins it is $300.  If you think she should continue this she can possibly do but she would like to see if there any other options.  She has only had one injection so far.

## 2023-05-25 NOTE — Progress Notes (Signed)
 COLBIE SLIKER is a 68 y.o. female with the following history as recorded in EpicCare:  Patient Active Problem List   Diagnosis Date Noted   Pre-diabetes 06/03/2017   Hypochromic-microcytic anemia 07/13/2011   Transaminitis 07/13/2011   Factor 5 Leiden mutation, heterozygous (HCC) 06/23/2011   VITAMIN D  DEFICIENCY 03/24/2010   HYPERLIPIDEMIA 03/03/2007   GERD 09/07/2006   Osteoporosis 09/07/2006    Current Outpatient Medications  Medication Sig Dispense Refill   aspirin EC 81 MG tablet Take 81 mg by mouth daily.     calcium  carbonate (OS-CAL) 600 MG TABS Take 600 mg by mouth daily.      cholecalciferol (VITAMIN D3) 25 MCG (1000 UNIT) tablet Take 1,000 Units by mouth daily.     denosumab  (PROLIA ) 60 MG/ML SOSY injection TO BE GIVEN IN OFFICE.  PT HAS APPT 11/03/22.  DX CODE: M81.0 1 mL 0   FLUoxetine  (PROZAC ) 20 MG capsule Take 1 capsule (20 mg total) by mouth daily. 90 capsule 3   Omega-3 Fatty Acids (FISH OIL) 1000 MG CAPS Take 1,000 mg by mouth daily.      rosuvastatin  (CRESTOR ) 20 MG tablet Take 1 tablet (20 mg total) by mouth daily. 90 tablet 1   traZODone  (DESYREL ) 50 MG tablet Take 0.5-1 tablets (25-50 mg total) by mouth at bedtime as needed for sleep. 90 tablet 1   Current Facility-Administered Medications  Medication Dose Route Frequency Provider Last Rate Last Admin   denosumab  (PROLIA ) injection 60 mg  60 mg Subcutaneous Once Copland, Jessica C, MD        Allergies: Patient has no known allergies.  Past Medical History:  Diagnosis Date   Clotting disorder (HCC)    Factor 5 Leiden mutation, heterozygous (HCC)    Dr Freddie, Hematology   GERD 09/07/2006   History of anemia    Hyperlipemia    Hypochromic-microcytic anemia 07/13/2011   Hb 10.9 MCV 83.5    Osteopenia    [facility]   Osteoporosis    Transaminitis 07/13/2011   Borderline  SGPT 38  06/18/11 normal SGOT   Vitamin D  deficiency 2008    Past Surgical History:  Procedure Laterality Date   CARPAL  TUNNEL RELEASE Right    RUE   COLONOSCOPY  2007   Negative; Dr Avram   UPPER GASTROINTESTINAL ENDOSCOPY  2007   hiatal hernia    Family History  Problem Relation Age of Onset   Colon polyps Mother    Osteoporosis Mother    Hyperlipidemia Mother    Hypothyroidism Mother    CVA Mother 81   Hyperlipidemia Father    Aortic aneurysm Father        smoker   Diabetes Brother        died in sleep in context of pulmonary infection @ 80   Hyperlipidemia Brother    Clotting disorder Brother        factor 5 deficiency with DVT   Diabetes Maternal Grandmother    Heart attack Neg Hx    Colon cancer Neg Hx    Esophageal cancer Neg Hx    Rectal cancer Neg Hx    Stomach cancer Neg Hx     Social History   Tobacco Use   Smoking status: Never   Smokeless tobacco: Never  Substance Use Topics   Alcohol use: No    Alcohol/week: 0.0 standard drinks of alcohol    Comment: rarely    Subjective:   1 day history of burning with urination;  did see some blood when wiping; no fever;  Concern for UTI; symptoms x 1 day; recently extended period of sitting/ got dehydrated;    Objective:  Vitals:   05/25/23 1409  BP: 108/66  Pulse: (!) 50  SpO2: 97%  Weight: 112 lb 12.8 oz (51.2 kg)  Height: 5' 2 (1.575 m)    General: Well developed, well nourished, in no acute distress  Skin : Warm and dry.  Head: Normocephalic and atraumatic  Lungs: Respirations unlabored; clear to auscultation bilaterally without wheeze, rales, rhonchi  CVS exam: normal rate and regular rhythm.  Musculoskeletal: No deformities; no active joint inflammation  Extremities: No edema, cyanosis, clubbing  Vessels: Symmetric bilaterally  Neurologic: Alert and oriented; speech intact; face symmetrical; moves all extremities well; CNII-XII intact without focal deficit    Assessment:  1. Hematuria, unspecified type   2. Dysuria     Plan:   Suspect UTI; check U/A and urine culture; Rx for Bactrim  DS bid x 5 days; follow  up to be determined based on culture results.   No follow-ups on file.  Orders Placed This Encounter  Procedures   Urine Culture   POCT Urinalysis Dipstick    Requested Prescriptions    No prescriptions requested or ordered in this encounter

## 2023-05-26 ENCOUNTER — Ambulatory Visit: Payer: Medicare (Managed Care) | Admitting: Family Medicine

## 2023-05-28 ENCOUNTER — Encounter: Payer: Self-pay | Admitting: Family

## 2023-05-28 LAB — URINE CULTURE
MICRO NUMBER:: 16039130
SPECIMEN QUALITY:: ADEQUATE

## 2023-06-08 MED ORDER — DENOSUMAB 60 MG/ML ~~LOC~~ SOSY
60.0000 mg | PREFILLED_SYRINGE | SUBCUTANEOUS | 0 refills | Status: DC
  Filled 2023-06-08 – 2023-06-09 (×2): qty 1, 180d supply, fill #0

## 2023-06-08 NOTE — Addendum Note (Signed)
 Addended by: Thelma Barge D on: 06/08/2023 06:05 PM   Modules accepted: Orders

## 2023-06-09 ENCOUNTER — Other Ambulatory Visit: Payer: Self-pay

## 2023-06-09 ENCOUNTER — Encounter (HOSPITAL_COMMUNITY): Payer: Self-pay

## 2023-06-09 ENCOUNTER — Other Ambulatory Visit (HOSPITAL_COMMUNITY): Payer: Self-pay

## 2023-06-09 NOTE — Progress Notes (Signed)
 Pharmacy Patient Advocate Encounter  Insurance verification completed.   The patient is insured through [employer] claim for Prolia. Currently a quantity of 1ml is a 180 day supply and the co-pay is $300.   This test claim was processed through [facility]- copay amounts may vary at other pharmacies due to pharmacy/plan contracts, or as the patient moves through the different stages of their insurance plan.

## 2023-06-09 NOTE — Progress Notes (Signed)
 Specialty Pharmacy Initial Fill Coordination Note  Shannon West is a 68 y.o. female contacted today regarding initial fill of specialty medication(s) Denosumab (PROLIA)   Patient requested Courier to Provider Office   Delivery date: 06/14/23   Verified address: [facility] [address], [address]   Medication will be filled on 2/21.   Patient is aware of $300 copayment.

## 2023-06-14 NOTE — Telephone Encounter (Signed)
Prolia in fridge

## 2023-06-17 ENCOUNTER — Ambulatory Visit (INDEPENDENT_AMBULATORY_CARE_PROVIDER_SITE_OTHER): Payer: Medicare (Managed Care)

## 2023-06-17 DIAGNOSIS — M810 Age-related osteoporosis without current pathological fracture: Secondary | ICD-10-CM

## 2023-06-17 MED ORDER — DENOSUMAB 60 MG/ML ~~LOC~~ SOSY
60.0000 mg | PREFILLED_SYRINGE | SUBCUTANEOUS | Status: AC
Start: 2023-12-14 — End: 2023-12-22
  Administered 2023-12-22: 60 mg via SUBCUTANEOUS

## 2023-06-17 NOTE — Progress Notes (Addendum)
 Patient here for prolia injection per physicians orders.  Injection was received from pharmacy.  Injection given left subq and patient tolerated well.

## 2023-06-17 NOTE — Addendum Note (Signed)
 Addended by: Thelma Barge D on: 06/17/2023 11:46 AM   Modules accepted: Orders

## 2023-11-02 ENCOUNTER — Other Ambulatory Visit: Payer: Self-pay | Admitting: Family Medicine

## 2023-11-02 DIAGNOSIS — E782 Mixed hyperlipidemia: Secondary | ICD-10-CM

## 2023-11-09 DIAGNOSIS — H2513 Age-related nuclear cataract, bilateral: Secondary | ICD-10-CM | POA: Diagnosis not present

## 2023-11-09 DIAGNOSIS — H52223 Regular astigmatism, bilateral: Secondary | ICD-10-CM | POA: Diagnosis not present

## 2023-11-09 DIAGNOSIS — H524 Presbyopia: Secondary | ICD-10-CM | POA: Diagnosis not present

## 2023-11-09 DIAGNOSIS — H5203 Hypermetropia, bilateral: Secondary | ICD-10-CM | POA: Diagnosis not present

## 2023-11-12 ENCOUNTER — Telehealth: Payer: Self-pay

## 2023-11-12 ENCOUNTER — Other Ambulatory Visit (HOSPITAL_COMMUNITY): Payer: Self-pay

## 2023-11-12 NOTE — Telephone Encounter (Signed)
 Pt ready for scheduling for PROLIA  on or after : 12/15/23  Option# 1: Buy/Bill (Office supplied medication)  Out-of-pocket cost due at time of clinic visit: $357  Number of injection/visits approved: 2  Primary: [employer] Prolia  co-insurance: 20% Admin fee co-insurance: 20%  Secondary: --- Prolia  co-insurance:  Admin fee co-insurance:   Medical Benefit Details: Date Benefits were checked: 11/12/23 Deductible: NO/ Coinsurance: 20%/ Admin Fee: 20%  Prior Auth: APPROVED PA# [id number] Expiration Date: 05/12/23-05/10/24  # of doses approved: 2 ----------------------------------------------------------------------- Option# 2- Med Obtained from pharmacy:  Pharmacy benefit: Copay $300 (Paid to pharmacy) Admin Fee: 20% approximately $25 (Pay at clinic)  Prior Auth: N/A PA# Expiration Date:   # of doses approved:   If patient wants fill through the pharmacy benefit please send prescription to: [facility], and include estimated need by date in rx notes. Pharmacy will ship medication directly to the office.  Patient NOT eligible for Prolia  Copay Card. Copay Card can make patient's cost as little as $25. Link to apply: [email or web address]://[email or web address]  ** This summary of benefits is an estimation of the patient's out-of-pocket cost. Exact cost may very based on individual plan coverage.

## 2023-11-12 NOTE — Telephone Encounter (Signed)
 Prolia  VOB initiated via [email or web address]  Next Prolia  inj DUE: 12/15/23

## 2023-11-12 NOTE — Telephone Encounter (Signed)
 SABRA

## 2023-11-15 ENCOUNTER — Telehealth: Payer: Self-pay | Admitting: *Deleted

## 2023-11-15 NOTE — Telephone Encounter (Signed)
 Pt works for [employer] but has [employer].  Is there anywhere she can possibly get this medication cheaper.  Her cost:  Medical : $357 or Pharmacy $300 with $25 admin fee.

## 2023-11-16 ENCOUNTER — Other Ambulatory Visit: Payer: Self-pay

## 2023-11-16 ENCOUNTER — Other Ambulatory Visit: Payer: Self-pay | Admitting: *Deleted

## 2023-11-16 DIAGNOSIS — M810 Age-related osteoporosis without current pathological fracture: Secondary | ICD-10-CM

## 2023-11-16 MED ORDER — DENOSUMAB 60 MG/ML ~~LOC~~ SOSY
60.0000 mg | PREFILLED_SYRINGE | SUBCUTANEOUS | 0 refills | Status: AC
  Filled 2023-11-16 – 2023-12-10 (×2): qty 1, 180d supply, fill #0

## 2023-11-16 NOTE — Telephone Encounter (Signed)
 Pt will get from pharmacy.  Appt scheduled for 12/22/23.

## 2023-11-18 DIAGNOSIS — Z808 Family history of malignant neoplasm of other organs or systems: Secondary | ICD-10-CM | POA: Diagnosis not present

## 2023-11-18 DIAGNOSIS — L820 Inflamed seborrheic keratosis: Secondary | ICD-10-CM | POA: Diagnosis not present

## 2023-11-18 DIAGNOSIS — L821 Other seborrheic keratosis: Secondary | ICD-10-CM | POA: Diagnosis not present

## 2023-11-18 DIAGNOSIS — D2372 Other benign neoplasm of skin of left lower limb, including hip: Secondary | ICD-10-CM | POA: Diagnosis not present

## 2023-11-18 DIAGNOSIS — Z129 Encounter for screening for malignant neoplasm, site unspecified: Secondary | ICD-10-CM | POA: Diagnosis not present

## 2023-11-19 DIAGNOSIS — Z0100 Encounter for examination of eyes and vision without abnormal findings: Secondary | ICD-10-CM | POA: Diagnosis not present

## 2023-11-27 ENCOUNTER — Encounter: Payer: Self-pay | Admitting: Family Medicine

## 2023-11-27 DIAGNOSIS — D649 Anemia, unspecified: Secondary | ICD-10-CM

## 2023-11-27 DIAGNOSIS — R7303 Prediabetes: Secondary | ICD-10-CM

## 2023-11-27 DIAGNOSIS — Z1329 Encounter for screening for other suspected endocrine disorder: Secondary | ICD-10-CM

## 2023-11-27 DIAGNOSIS — E785 Hyperlipidemia, unspecified: Secondary | ICD-10-CM

## 2023-12-10 ENCOUNTER — Other Ambulatory Visit: Payer: Self-pay

## 2023-12-10 ENCOUNTER — Encounter: Payer: Self-pay | Admitting: Family Medicine

## 2023-12-10 NOTE — Progress Notes (Signed)
 Specialty Pharmacy Refill Coordination Note  Shannon West is a 68 y.o. female contacted today regarding refills of specialty medication(s) Denosumab  (PROLIA )   Patient requested Courier to Provider Office   Delivery date: 12/15/23   Verified address: [facility], [address]   Medication will be filled on 08.26.25.    patient confirmed copay, and provided updated cc - courier to mdo 8.28

## 2023-12-13 ENCOUNTER — Other Ambulatory Visit: Payer: Self-pay

## 2023-12-14 ENCOUNTER — Other Ambulatory Visit (INDEPENDENT_AMBULATORY_CARE_PROVIDER_SITE_OTHER): Payer: Medicare (Managed Care)

## 2023-12-14 DIAGNOSIS — R7303 Prediabetes: Secondary | ICD-10-CM

## 2023-12-14 DIAGNOSIS — D649 Anemia, unspecified: Secondary | ICD-10-CM

## 2023-12-14 DIAGNOSIS — Z1329 Encounter for screening for other suspected endocrine disorder: Secondary | ICD-10-CM

## 2023-12-14 DIAGNOSIS — Z5181 Encounter for therapeutic drug level monitoring: Secondary | ICD-10-CM | POA: Diagnosis not present

## 2023-12-14 DIAGNOSIS — E785 Hyperlipidemia, unspecified: Secondary | ICD-10-CM | POA: Diagnosis not present

## 2023-12-14 NOTE — Addendum Note (Signed)
 Addended by: TRUDY CURVIN RAMAN on: 12/14/2023 03:23 PM   Modules accepted: Orders

## 2023-12-14 NOTE — Addendum Note (Signed)
 Addended by: TRUDY CURVIN RAMAN on: 12/14/2023 03:24 PM   Modules accepted: Orders

## 2023-12-15 ENCOUNTER — Encounter: Payer: Self-pay | Admitting: Family Medicine

## 2023-12-15 LAB — COMPREHENSIVE METABOLIC PANEL WITH GFR
ALT: 18 U/L (ref 0–35)
AST: 23 U/L (ref 0–37)
Albumin: 4 g/dL (ref 3.5–5.2)
Alkaline Phosphatase: 43 U/L (ref 39–117)
BUN: 21 mg/dL (ref 6–23)
CO2: 25 meq/L (ref 19–32)
Calcium: 8.5 mg/dL (ref 8.4–10.5)
Chloride: 104 meq/L (ref 96–112)
Creatinine, Ser: 0.66 mg/dL (ref 0.40–1.20)
GFR: 89.74 mL/min (ref >60.00)
Glucose, Bld: 130 mg/dL — ABNORMAL HIGH (ref 70–99)
Potassium: 4 meq/L (ref 3.5–5.1)
Sodium: 139 meq/L (ref 135–145)
Total Bilirubin: 0.4 mg/dL (ref 0.2–1.2)
Total Protein: 6.5 g/dL (ref 6.0–8.3)

## 2023-12-15 LAB — TSH: TSH: 1.51 u[IU]/mL (ref 0.35–5.50)

## 2023-12-15 LAB — LIPID PANEL
Cholesterol: 108 mg/dL (ref 0–200)
HDL: 44.5 mg/dL (ref >39.00)
LDL Cholesterol: 50 mg/dL (ref 0–99)
NonHDL: 63.19
Total CHOL/HDL Ratio: 2
Triglycerides: 65 mg/dL (ref 0.0–149.0)
VLDL: 13 mg/dL (ref 0.0–40.0)

## 2023-12-15 LAB — CALCIUM: Calcium: 8.6 mg/dL (ref 8.4–10.5)

## 2023-12-22 ENCOUNTER — Ambulatory Visit (INDEPENDENT_AMBULATORY_CARE_PROVIDER_SITE_OTHER): Payer: Medicare (Managed Care) | Admitting: *Deleted

## 2023-12-22 ENCOUNTER — Ambulatory Visit: Payer: Medicare (Managed Care)

## 2023-12-22 DIAGNOSIS — M810 Age-related osteoporosis without current pathological fracture: Secondary | ICD-10-CM | POA: Diagnosis not present

## 2023-12-22 MED ORDER — DENOSUMAB 60 MG/ML ~~LOC~~ SOSY: 60.0000 mg | PREFILLED_SYRINGE | SUBCUTANEOUS | Status: AC

## 2023-12-22 NOTE — Progress Notes (Signed)
 Patient here for Prolia injection per physicians orders  Prolia 60 mg SQ , was administered left arm today. Patient tolerated injection.  Patient next injection due: 6 months, appt made:   No- will schedule in 5 months after benefits are ran again  Initial injection: no  Did Prolia come from pharmacy (if yes please select patient supplied): yes  Cam placed for next injection yes

## 2023-12-23 ENCOUNTER — Other Ambulatory Visit (HOSPITAL_COMMUNITY): Payer: Self-pay

## 2023-12-26 ENCOUNTER — Other Ambulatory Visit: Payer: Self-pay | Admitting: Family Medicine

## 2023-12-26 DIAGNOSIS — G4700 Insomnia, unspecified: Secondary | ICD-10-CM

## 2024-02-09 NOTE — Progress Notes (Addendum)
 [facility] [address], [address] [place], [place] [address] [phone]  Date:  02/21/2024   Name:  Shannon West   DOB:  [birth year]   MRN:  [id number]  PCP:  Watt Harlene BROCKS, MD    Chief Complaint: No chief complaint on file.   History of Present Illness:  Shannon West is a 68 y.o. very pleasant female patient who presents with the following:  Patient seen today for physical exam-I saw her most recently about a year ago also for her physical History of prediabetes, hyperlipidemia, osteoporosis, factor V Leiden carrier/microcytic anemia  When I saw her last year Shannon West had recently needed to move her mother from her own home into a care facility as she was not able to continue caring for her mom by herself.  This was a very stressful situation and Shannon West had lost a few pounds due to stress.   She is using Prolia  for osteoporosis-she has received 3 shots so far Aspirin 81 Calcium  and vitamin D  Fluoxetine  20- not taking Crestor  Trazodone  We updated her labs in August because she needed an updated calcium  prior to Prolia -labs all looked good We could update an A1c for her today if she would like  -Mammogram can be updated -DEXA scan can be updated this year; she prefers to do in 6 months after her 4th Prolia  injection -Colonoscopy 2024, 5-year recheck -Flu vaccine recommend - give today  -COVID booster Discussed the use of AI scribe software for clinical note transcription with the patient, who gave verbal consent to proceed.  Lab Results  Component Value Date   HGBA1C 6.1 02/18/2023   Wt Readings from Last 3 Encounters:  02/21/24 109 lb (49.4 kg)  05/25/23 112 lb 12.8 oz (51.2 kg)  02/18/23 113 lb 3.2 oz (51.3 kg)   History of Present Illness Shannon West is a 68 year old female who presents for a routine follow-up visit.  She has discontinued Prozac  but keeps it available for future use if needed. She is  currently taking Crestor  and Trazodone , with a recent increase in Trazodone  to 50 mg. She also takes vitamin D  and occasionally calcium .  She is scheduled for a mammogram next Saturday and has had three Prolia  injections, with the last one being two years ago. She feels 'slumpy' and hopes Prolia  will help with bone density.  She experiences arthritis pain, particularly in her thumb joint, and finds relief with Voltaren cream and liquid turmeric. The pain is manageable, so she has not pursued further treatment.  We discussed having her see orthopedics but right now she is okay with that she is already doing  She reports urinary incontinence, particularly with urgency and sometimes when coughing or sneezing.  She exercises at the gym one to two times a week, focusing on strength training and uses a treadmill at home during the winter. She has reduced her planking due to back pain. She has lost some weight after reducing sugar intake and is mindful of maintaining her weight by increasing healthy fats like avocado and olive oil in her diet.  No chest pain or difficulty breathing. No side effects from Prolia .    Patient Active Problem List   Diagnosis Date Noted   Pre-diabetes 06/03/2017   Hypochromic-microcytic anemia 07/13/2011   Transaminitis 07/13/2011   Factor 5 Leiden mutation, heterozygous 06/23/2011   VITAMIN D  DEFICIENCY 03/24/2010   HYPERLIPIDEMIA 03/03/2007   GERD 09/07/2006  Osteoporosis 09/07/2006    Past Medical History:  Diagnosis Date   Clotting disorder    Factor 5 Leiden mutation, heterozygous    Dr Freddie, Hematology   GERD 09/07/2006   History of anemia    Hyperlipemia    Hypochromic-microcytic anemia 07/13/2011   Hb 10.9 MCV 83.5    Osteopenia    [facility]   Osteoporosis    Transaminitis 07/13/2011   Borderline  SGPT 38  06/18/11 normal SGOT   Vitamin D  deficiency 2008    Past Surgical History:  Procedure Laterality Date   CARPAL TUNNEL  RELEASE Right    RUE   COLONOSCOPY  2007   Negative; Dr Avram   UPPER GASTROINTESTINAL ENDOSCOPY  2007   hiatal hernia    Social History   Tobacco Use   Smoking status: Never   Smokeless tobacco: Never  Substance Use Topics   Alcohol use: No    Alcohol/week: 0.0 standard drinks of alcohol    Comment: rarely   Drug use: No    Family History  Problem Relation Age of Onset   Colon polyps Mother    Osteoporosis Mother    Hyperlipidemia Mother    Hypothyroidism Mother    CVA Mother 56   Hyperlipidemia Father    Aortic aneurysm Father        smoker   Diabetes Brother        died in sleep in context of pulmonary infection @ 84   Hyperlipidemia Brother    Clotting disorder Brother        factor 5 deficiency with DVT   Diabetes Maternal Grandmother    Heart attack Neg Hx    Colon cancer Neg Hx    Esophageal cancer Neg Hx    Rectal cancer Neg Hx    Stomach cancer Neg Hx     No Known Allergies  Medication list has been reviewed and updated.  Current Outpatient Medications on File Prior to Visit  Medication Sig Dispense Refill   aspirin EC 81 MG tablet Take 81 mg by mouth daily.     calcium  carbonate (OS-CAL) 600 MG TABS Take 600 mg by mouth daily.      cholecalciferol (VITAMIN D3) 25 MCG (1000 UNIT) tablet Take 1,000 Units by mouth daily.     denosumab  (PROLIA ) 60 MG/ML SOSY injection Inject 60 mg into the skin every 6 (six) months. DX CODE: M81.0.  pt has appt on 12/22/23 1 mL 0   Omega-3 Fatty Acids (FISH OIL) 1000 MG CAPS Take 1,000 mg by mouth daily.      rosuvastatin  (CRESTOR ) 20 MG tablet Take 1 tablet (20 mg total) by mouth daily. 90 tablet 1   traZODone  (DESYREL ) 50 MG tablet Take 0.5-1 tablets (25-50 mg total) by mouth at bedtime as needed for sleep. 90 tablet 1   Current Facility-Administered Medications on File Prior to Visit  Medication Dose Route Frequency Provider Last Rate Last Admin   [START ON 06/19/2024] denosumab  (PROLIA ) injection 60 mg  60 mg  Subcutaneous Q6 months Shannon West, Harlene BROCKS, MD        Review of Systems:  As per HPI- otherwise negative.   Physical Examination: Vitals:   02/21/24 0859  BP: 117/68  Pulse: (!) 59  Resp: 16  Temp: 98.2 F (36.8 C)  SpO2: 100%   Vitals:   02/21/24 0859  Weight: 109 lb (49.4 kg)  Height: 5' 2 (1.575 m)   Body mass index is 19.94 kg/m. Ideal  Body Weight: Weight in (lb) to have BMI = 25: 136.4  GEN: no acute distress. Slim build, looks well  HEENT: Atraumatic, Normocephalic.  Bilateral TM wnl, oropharynx normal.  PEERL,EOMI.   Ears and Nose: No external deformity. CV: RRR, No M/G/R. No JVD. No thrill. No extra heart sounds. PULM: CTA B, no wheezes, crackles, rhonchi. No retractions. No resp. distress. No accessory muscle use. ABD: S, NT, ND, +BS. No rebound. No HSM. EXTR: No c/c/e PSYCH: Normally interactive. Conversant.    Assessment and Plan: Physical exam  Age-related osteoporosis without current pathological fracture  Pre-diabetes  GAD (generalized anxiety disorder)  Assessment & Plan Adult Wellness Visit Routine wellness visit with no new acute issues. Discussed family dynamics and stress related to mother's care. Exercises regularly and maintains a healthy lifestyle. - Administer flu shot. - Update mammogram. - Check A1c and calcium  levels.  Osteoporosis Osteoporosis managed with Prolia  injections. Prolia  expected to improve bone density. No side effects reported. Prefers bone density scan after next Prolia  injection to complete four total injections. - Schedule bone density scan after next Prolia  injection. - Continue Prolia  injections.  Prediabetes Prediabetes monitored with A1c. Reduced sugar intake and undesired weight loss noted. Encouraged increased healthy fat intake to prevent further weight loss. - Check A1c level. - Encourage increasing healthy fat intake to prevent further weight loss.  Mixed hyperlipidemia Mixed hyperlipidemia managed  with Crestor . - Refill Crestor  prescription.  Urinary incontinence Urinary incontinence with urgency and occasional leakage. Discussed Kegel exercises and medication options. Agreed to try Detrol 2 mg. - Prescribe Detrol 2 mg for urinary incontinence. - Instruct on Kegel exercises to improve pelvic floor strength.  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 Relief with Voltaren cream and liquid turmeric. Prefers to avoid steroid injections currently. - Continue using Voltaren cream and liquid turmeric. - Consider referral to orthopedics for steroid injection if pain worsens.  Signed Harlene Schroeder, MD   Received labs as below, message to patient Results for orders placed or performed in visit on 02/21/24  Basic metabolic panel with GFR   Collection Time: 02/21/24  9:31 AM  Result Value Ref Range   Sodium 139 135 - 145 mEq/L   Potassium 4.7 3.5 - 5.1 mEq/L   Chloride 103 96 - 112 mEq/L   CO2 30 19 - 32 mEq/L   Glucose, Bld 76 70 - 99 mg/dL   BUN 17 6 - 23 mg/dL   Creatinine, Ser 9.27 0.40 - 1.20 mg/dL   GFR 14.57 >39.99 mL/min   Calcium  9.2 8.4 - 10.5 mg/dL  Hemoglobin J8r   Collection Time: 02/21/24  9:31 AM  Result Value Ref Range   Hgb A1c MFr Bld 5.6 4.6 - 6.5 %

## 2024-02-09 NOTE — Patient Instructions (Addendum)
 It was great to see you again today, I will be in touch with your labs Let's plan to do your bone density in about 6 months Try the detrol la for your bladder symptoms- kegel exercises can also help

## 2024-02-10 DIAGNOSIS — Z1231 Encounter for screening mammogram for malignant neoplasm of breast: Secondary | ICD-10-CM

## 2024-02-14 ENCOUNTER — Other Ambulatory Visit: Payer: Self-pay | Admitting: Family Medicine

## 2024-02-14 ENCOUNTER — Encounter: Payer: Self-pay | Admitting: Family Medicine

## 2024-02-14 DIAGNOSIS — Z1231 Encounter for screening mammogram for malignant neoplasm of breast: Secondary | ICD-10-CM

## 2024-02-21 ENCOUNTER — Encounter: Payer: Self-pay | Admitting: Family Medicine

## 2024-02-21 ENCOUNTER — Ambulatory Visit: Payer: Medicare (Managed Care) | Admitting: Family Medicine

## 2024-02-21 VITALS — BP 117/68 | HR 59 | Temp 98.2°F | Resp 16 | Ht 62.0 in | Wt 109.0 lb

## 2024-02-21 DIAGNOSIS — Z23 Encounter for immunization: Secondary | ICD-10-CM

## 2024-02-21 DIAGNOSIS — Z0000 Encounter for general adult medical examination without abnormal findings: Secondary | ICD-10-CM

## 2024-02-21 DIAGNOSIS — F411 Generalized anxiety disorder: Secondary | ICD-10-CM | POA: Diagnosis not present

## 2024-02-21 DIAGNOSIS — M810 Age-related osteoporosis without current pathological fracture: Secondary | ICD-10-CM

## 2024-02-21 DIAGNOSIS — E782 Mixed hyperlipidemia: Secondary | ICD-10-CM | POA: Diagnosis not present

## 2024-02-21 DIAGNOSIS — N3946 Mixed incontinence: Secondary | ICD-10-CM | POA: Diagnosis not present

## 2024-02-21 DIAGNOSIS — R7303 Prediabetes: Secondary | ICD-10-CM

## 2024-02-21 LAB — BASIC METABOLIC PANEL WITH GFR
BUN: 17 mg/dL (ref 6–23)
CO2: 30 meq/L (ref 19–32)
Calcium: 9.2 mg/dL (ref 8.4–10.5)
Chloride: 103 meq/L (ref 96–112)
Creatinine, Ser: 0.72 mg/dL (ref 0.40–1.20)
GFR: 85.42 mL/min (ref >60.00)
Glucose, Bld: 76 mg/dL (ref 70–99)
Potassium: 4.7 meq/L (ref 3.5–5.1)
Sodium: 139 meq/L (ref 135–145)

## 2024-02-21 LAB — HEMOGLOBIN A1C: Hgb A1c MFr Bld: 5.6 % (ref 4.6–6.5)

## 2024-02-21 MED ORDER — TOLTERODINE TARTRATE ER 2 MG PO CP24: 2.0000 mg | ORAL_CAPSULE | Freq: Every day | ORAL | 3 refills | Status: DC

## 2024-02-21 MED ORDER — ROSUVASTATIN CALCIUM 20 MG PO TABS: 20.0000 mg | ORAL_TABLET | Freq: Every day | ORAL | 3 refills | Status: AC

## 2024-02-21 MED ORDER — ROSUVASTATIN CALCIUM 20 MG PO TABS: 20.0000 mg | ORAL_TABLET | Freq: Every day | ORAL | 3 refills | Status: DC

## 2024-02-26 ENCOUNTER — Ambulatory Visit: Payer: Medicare (Managed Care)

## 2024-02-26 DIAGNOSIS — Z1231 Encounter for screening mammogram for malignant neoplasm of breast: Secondary | ICD-10-CM

## 2024-02-27 ENCOUNTER — Encounter: Payer: Self-pay | Admitting: Family Medicine

## 2024-02-27 DIAGNOSIS — N3946 Mixed incontinence: Secondary | ICD-10-CM

## 2024-02-28 MED ORDER — TOLTERODINE TARTRATE ER 2 MG PO CP24: 2.0000 mg | ORAL_CAPSULE | Freq: Every day | ORAL | 3 refills | Status: AC

## 2024-05-16 ENCOUNTER — Ambulatory Visit (INDEPENDENT_AMBULATORY_CARE_PROVIDER_SITE_OTHER): Payer: Medicare (Managed Care) | Admitting: *Deleted

## 2024-05-16 VITALS — Ht 62.0 in | Wt 109.0 lb

## 2024-05-16 DIAGNOSIS — Z0000 Encounter for general adult medical examination without abnormal findings: Secondary | ICD-10-CM

## 2024-05-16 DIAGNOSIS — M810 Age-related osteoporosis without current pathological fracture: Secondary | ICD-10-CM

## 2024-05-16 NOTE — Progress Notes (Signed)
 " Please attest this visit in the absence of patient primary care provider.   Chief Complaint  Patient presents with   Medicare Wellness     Subjective:   Shannon West is a 69 y.o. female who presents for a Medicare Annual Wellness Visit.  Visit info / Clinical Intake: Medicare Wellness Visit Type:: Subsequent Annual Wellness Visit Persons participating in visit and providing information:: patient Medicare Wellness Visit Mode:: Telephone If telephone:: video declined Since this visit was completed virtually, some vitals may be partially provided or unavailable. Missing vitals are due to the limitations of the virtual format.: Unable to obtain vitals - no equipment If Telephone or Video please confirm:: I connected with patient using audio/video enable telemedicine. I verified patient identity with two identifiers, discussed telehealth limitations, and patient agreed to proceed. Patient Location:: home Provider Location:: office Interpreter Needed?: No Pre-visit prep was completed: yes AWV questionnaire completed by patient prior to visit?: no Living arrangements:: lives with spouse/significant other Patient's Overall Health Status Rating: excellent Typical amount of pain: none Does pain affect daily life?: no Are you currently prescribed opioids?: no  Dietary Habits and Nutritional Risks How many meals a day?: 2 (2-3) Eats fruit and vegetables daily?: yes Most meals are obtained by: preparing own meals In the last 2 weeks, have you had any of the following?: none Diabetic:: no  Functional Status Activities of Daily Living (to include ambulation/medication): Independent Ambulation: Independent Medication Administration: Independent Home Management (perform basic housework or laundry): Independent Manage your own finances?: yes Primary transportation is: driving Concerns about vision?: no *vision screening is required for WT[facility]* (Up to date with the [facility]) Concerns about hearing?: (!) yes (likes the TV loud, will check with insurance regarding covered providers for hearing testing) Uses hearing aids?: no  Fall Screening Falls in the past year?: 0 Number of falls in past year: 0 Was there an injury with Fall?: 0 Fall Risk Category Calculator: 0 Patient Fall Risk Level: Low Fall Risk  Fall Risk Patient at Risk for Falls Due to: No Fall Risks Fall risk Follow up: Falls evaluation completed  Home and Transportation Safety: All rugs have non-skid backing?: yes All stairs or steps have railings?: (!) no (one step up into the house) Grab bars in the bathtub or shower?: (!) no Have non-skid surface in bathtub or shower?: yes Good home lighting?: yes Regular seat belt use?: yes Hospital stays in the last year:: no  Cognitive Assessment Difficulty concentrating, remembering, or making decisions? : yes (forgets where she has placed an item) Will 6CIT or Mini Cog be Completed: yes What year is it?: 0 points What month is it?: 0 points Give patient an address phrase to remember (5 components): [address] [place]  About what time is it?: 0 points Count backwards from 20 to 1: 0 points Say the months of the year in reverse: 0 points Repeat the address phrase from earlier: 0 points 6 CIT Score: 0 points  Advance Directives (For Healthcare) Does Patient Have a Medical Advance Directive?: Yes Type of Advance Directive: Living will; Healthcare Power of Attorney Copy of Healthcare Power of Attorney in Chart?: No - copy requested Copy of Living Will in Chart?: No - copy requested Would patient like information on creating a medical advance directive?: No - Patient declined  Reviewed/Updated  Reviewed/Updated: Reviewed All (Medical, Surgical, Family, Medications, Allergies, Care Teams, Patient Goals)    Allergies (verified) Patient has no known allergies.   Current Medications (  verified) Outpatient  Encounter Medications as of 05/16/2024  Medication Sig   aspirin EC 81 MG tablet Take 81 mg by mouth daily.   calcium  carbonate (OS-CAL) 600 MG TABS Take 600 mg by mouth daily.    cholecalciferol (VITAMIN D3) 25 MCG (1000 UNIT) tablet Take 1,000 Units by mouth daily.   denosumab  (PROLIA ) 60 MG/ML SOSY injection Inject 60 mg into the skin every 6 (six) months. DX CODE: M81.0.  pt has appt on 12/22/23   Omega-3 Fatty Acids (FISH OIL) 1000 MG CAPS Take 1,000 mg by mouth daily.    rosuvastatin  (CRESTOR ) 20 MG tablet Take 1 tablet (20 mg total) by mouth daily.   tolterodine  (DETROL  LA) 2 MG 24 hr capsule Take 1 capsule (2 mg total) by mouth daily.   traZODone  (DESYREL ) 50 MG tablet Take 0.5-1 tablets (25-50 mg total) by mouth at bedtime as needed for sleep.   Facility-Administered Encounter Medications as of 05/16/2024  Medication   [START ON 06/19/2024] denosumab  (PROLIA ) injection 60 mg    History: Past Medical History:  Diagnosis Date   Clotting disorder    Factor 5 Leiden mutation, heterozygous    Dr Freddie, Hematology   GERD 09/07/2006   History of anemia    Hyperlipemia    Hypochromic-microcytic anemia 07/13/2011   Hb 10.9 MCV 83.5    Osteopenia    [facility]   Osteoporosis    Transaminitis 07/13/2011   Borderline  SGPT 38  06/18/11 normal SGOT   Vitamin D  deficiency 2008   Past Surgical History:  Procedure Laterality Date   CARPAL TUNNEL RELEASE Right    RUE   COLONOSCOPY  2007   Negative; Dr Avram   UPPER GASTROINTESTINAL ENDOSCOPY  2007   hiatal hernia   Family History  Problem Relation Age of Onset   Colon polyps Mother    Osteoporosis Mother    Hyperlipidemia Mother    Hypothyroidism Mother    CVA Mother 36   Hyperlipidemia Father    Aortic aneurysm Father        smoker   Diabetes Brother        died in sleep in context of pulmonary infection @ 47   Hyperlipidemia Brother    Clotting disorder Brother        factor 5 deficiency with DVT    Diabetes Maternal Grandmother    Heart attack Neg Hx    Colon cancer Neg Hx    Esophageal cancer Neg Hx    Rectal cancer Neg Hx    Stomach cancer Neg Hx    Social History   Occupational History   Not on file  Tobacco Use   Smoking status: Never   Smokeless tobacco: Never  Substance and Sexual Activity   Alcohol use: Yes    Comment: rarely   Drug use: No   Sexual activity: Yes    Partners: Male   Tobacco Counseling Counseling given: Not Answered  SDOH Screenings   Food Insecurity: No Food Insecurity (05/16/2024)  Housing: Low Risk (05/16/2024)  Transportation Needs: No Transportation Needs (05/16/2024)  Utilities: Not At Risk (05/16/2024)  Alcohol Screen: Low Risk (02/15/2024)  Depression (PHQ2-9): Low Risk (05/16/2024)  Financial Resource Strain: Low Risk (02/15/2024)  Physical Activity: Insufficiently Active (05/16/2024)  Social Connections: Socially Integrated (05/16/2024)  Stress: No Stress Concern Present (05/16/2024)  Tobacco Use: Low Risk (05/16/2024)   See flowsheets for full screening details  Depression Screen PHQ 2 & 9 Depression Scale- Over the past 2  weeks, how often have you been bothered by any of the following problems? Little interest or pleasure in doing things: 0 Feeling down, depressed, or hopeless (PHQ Adolescent also includes...irritable): 0 PHQ-2 Total Score: 0 Trouble falling or staying asleep, or sleeping too much: 0 Feeling tired or having little energy: 0 Poor appetite or overeating (PHQ Adolescent also includes...weight loss): 0 Feeling bad about yourself - or that you are a failure or have let yourself or your family down: 0 Trouble concentrating on things, such as reading the newspaper or watching television (PHQ Adolescent also includes...like school work): 0 Moving or speaking so slowly that other people could have noticed. Or the opposite - being so fidgety or restless that you have been moving around a lot more than usual: 0 Thoughts that you  would be better off dead, or of hurting yourself in some way: 0 PHQ-9 Total Score: 0 If you checked off any problems, how difficult have these problems made it for you to do your work, take care of things at home, or get along with other people?: Not difficult at all     Goals Addressed             This Visit's Progress    Patient Stated       Maintain current health     to increase exercise to 3 days a week, currently doing 2 days               Objective:    Today's Vitals   05/16/24 1102  Weight: 109 lb (49.4 kg)  Height: 5' 2 (1.575 m)   Body mass index is 19.94 kg/m.  Hearing/Vision screen No results found. Immunizations and Health Maintenance Health Maintenance  Topic Date Due   COVID-19 Vaccine (4 - 2025-26 season) 12/20/2023   Medicare Annual Wellness (AWV)  05/16/2025   Mammogram  02/25/2026   DTaP/Tdap/Td (3 - Td or Tdap) 05/21/2026   Colonoscopy  06/06/2027   Pneumococcal Vaccine: 50+ Years  Completed   Influenza Vaccine  Completed   Bone Density Scan  Completed   Hepatitis C Screening  Completed   Zoster Vaccines- Shingrix  Completed   Meningococcal B Vaccine  Aged Out        Assessment/Plan:  This is a routine wellness examination for Amore.  Patient Care Team: Copland, Harlene BROCKS, MD as PCP - General (Family Medicine) Pa, [facility] (Ophthalmology)  I have personally reviewed and noted the following in the patients chart:   Medical and social history Use of alcohol, tobacco or illicit drugs  Current medications and supplements including opioid prescriptions. Functional ability and status Nutritional status Physical activity Advanced directives List of other physicians Hospitalizations, surgeries, and ER visits in previous 12 months Vitals Screenings to include cognitive, depression, and falls Referrals and appointments  No orders of the defined types were placed in this encounter.  In addition, I have reviewed and  discussed with patient certain preventive protocols, quality metrics, and best practice recommendations. A written personalized care plan for preventive services as well as general preventive health recommendations were provided to patient.   Lolita Libra, CMA   05/16/2024   Return in 1 year (on 05/16/2025).  After Visit Summary: (MyChart) Due to this being a telephonic visit, the after visit summary with patients personalized plan was offered to patient via MyChart   Nurse Notes: HM Addressed: DEXA ordered  "

## 2024-05-16 NOTE — Patient Instructions (Addendum)
 Ms. Moustafa,  Thank you for taking the time for your Medicare Wellness Visit. I appreciate your continued commitment to your health goals. Please review the care plan we discussed, and feel free to reach out if I can assist you further.  Please note that Annual Wellness Visits do not include a physical exam. Some assessments may be limited, especially if the visit was conducted virtually. If needed, we may recommend an in-person follow-up with your provider.  Goals:  to increase exercise to 3 days a week, currently doing 2 days   Ongoing Care Seeing your primary care provider every 3 to 6 months helps us  monitor your health and provide consistent, personalized care.   Dr Watt: 11/08/24 9:20am Medicare AWV: 05/22/25 11am, telephone  Referrals If a referral was made during today's visit and you haven't received any updates within two weeks, please contact the referred provider directly to check on the status. Bone Density (you can schedule after your next Prolia  injection in March) @ [facility]: [phone]  Recommended Screenings:  Health Maintenance  Topic Date Due   COVID-19 Vaccine (4 - 2025-26 season) 05/16/2025*   Medicare Annual Wellness Visit  05/16/2025   Breast Cancer Screening  02/25/2026   DTaP/Tdap/Td vaccine (3 - Td or Tdap) 05/21/2026   Colon Cancer Screening  06/06/2027   Pneumococcal Vaccine for age over 56  Completed   Flu Shot  Completed   Osteoporosis screening with Bone Density Scan  Completed   Hepatitis C Screening  Completed   Zoster (Shingles) Vaccine  Completed   Meningitis B Vaccine  Aged Out  *Topic was postponed. The date shown is not the original due date.       05/16/2024   11:05 AM  Advanced Directives  Does Patient Have a Medical Advance Directive? Yes  Type of Advance Directive Living will;Healthcare Power of Attorney  Copy of Healthcare Power of Attorney in Chart? No - copy requested  Would patient like information on creating a  medical advance directive? No - Patient declined  Once completed and notarized, you may return a copy of your Advanced Directive(s) by either of the following: Bring a copy of your health care power of attorney and living will to the office to be added to your chart at your convenience. You can mail a copy to [facility] [address] [place], [place] [address] or email to [email or web address]   Vision: Annual vision screenings are recommended for early detection of glaucoma, cataracts, and diabetic retinopathy. These exams can also reveal signs of chronic conditions such as diabetes and high blood pressure.  Dental: Annual dental screenings help detect early signs of oral cancer, gum disease, and other conditions linked to overall health, including heart disease and diabetes.  Please see the attached documents for additional preventive care recommendations.

## 2024-05-25 ENCOUNTER — Telehealth: Payer: Self-pay

## 2024-05-25 ENCOUNTER — Other Ambulatory Visit (HOSPITAL_COMMUNITY): Payer: Self-pay

## 2024-05-25 NOTE — Telephone Encounter (Signed)
 Prolia  VOB initiated via [email or web address]  Next Prolia  inj DUE: 06/20/24

## 2024-05-26 NOTE — Telephone Encounter (Signed)
 MEDICAL PA SUBMITTED VIA [email or web address] Prior Auth (EOC) ID: [id number]

## 2024-05-26 NOTE — Telephone Encounter (Signed)
 SABRA

## 2024-11-08 ENCOUNTER — Ambulatory Visit: Payer: Medicare (Managed Care) | Admitting: Family Medicine

## 2025-05-22 ENCOUNTER — Ambulatory Visit: Payer: Medicare (Managed Care)
